# Patient Record
Sex: Female | Born: 1970
Health system: Southern US, Community
[De-identification: ages and names within clinical notes are randomized; demographics above are authoritative.]

## PROBLEM LIST (undated history)

## (undated) DIAGNOSIS — B379 Candidiasis, unspecified: Secondary | ICD-10-CM

## (undated) DIAGNOSIS — A499 Bacterial infection, unspecified: Secondary | ICD-10-CM

## (undated) DIAGNOSIS — A599 Trichomoniasis, unspecified: Secondary | ICD-10-CM

## (undated) DIAGNOSIS — I10 Essential (primary) hypertension: Secondary | ICD-10-CM

## (undated) DIAGNOSIS — K219 Gastro-esophageal reflux disease without esophagitis: Secondary | ICD-10-CM

## (undated) DIAGNOSIS — Z8619 Personal history of other infectious and parasitic diseases: Secondary | ICD-10-CM

## (undated) HISTORY — DX: Candidiasis, unspecified: B37.9

## (undated) HISTORY — PX: HERNIA REPAIR: SHX51

## (undated) HISTORY — DX: Gastro-esophageal reflux disease without esophagitis: K21.9

## (undated) HISTORY — DX: Essential (primary) hypertension: I10

## (undated) HISTORY — DX: Bacterial infection, unspecified: A49.9

## (undated) HISTORY — PX: TUBOPLASTY / TUBOTUBAL ANASTOMOSIS: SUR1392

## (undated) HISTORY — DX: Personal history of other infectious and parasitic diseases: Z86.19

## (undated) HISTORY — DX: Trichomoniasis, unspecified: A59.9

---
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
## 2005-05-21 ENCOUNTER — Emergency Department (HOSPITAL_COMMUNITY): Admission: EM | Admit: 2005-05-21 | Discharge: 2005-05-21 | Payer: Self-pay | Admitting: *Deleted

## 2005-05-25 ENCOUNTER — Other Ambulatory Visit: Admission: RE | Admit: 2005-05-25 | Discharge: 2005-05-25 | Payer: Self-pay | Admitting: Obstetrics and Gynecology

## 2005-05-30 ENCOUNTER — Ambulatory Visit (HOSPITAL_COMMUNITY): Admission: RE | Admit: 2005-05-30 | Discharge: 2005-05-30 | Payer: Self-pay | Admitting: Obstetrics and Gynecology

## 2006-10-22 ENCOUNTER — Ambulatory Visit (HOSPITAL_COMMUNITY): Admission: RE | Admit: 2006-10-22 | Discharge: 2006-10-22 | Payer: Self-pay | Admitting: Obstetrics and Gynecology

## 2007-02-06 ENCOUNTER — Encounter: Admission: RE | Admit: 2007-02-06 | Discharge: 2007-02-06 | Payer: Self-pay | Admitting: Obstetrics and Gynecology

## 2007-12-12 HISTORY — PX: TUBOPLASTY / TUBOTUBAL ANASTOMOSIS: SUR1392

## 2007-12-12 HISTORY — PX: MYOMECTOMY: SHX85

## 2008-05-20 ENCOUNTER — Encounter (INDEPENDENT_AMBULATORY_CARE_PROVIDER_SITE_OTHER): Payer: Self-pay | Admitting: Obstetrics and Gynecology

## 2008-05-20 ENCOUNTER — Inpatient Hospital Stay (HOSPITAL_COMMUNITY): Admission: RE | Admit: 2008-05-20 | Discharge: 2008-05-22 | Payer: Self-pay | Admitting: Obstetrics and Gynecology

## 2008-10-20 ENCOUNTER — Ambulatory Visit (HOSPITAL_COMMUNITY): Admission: RE | Admit: 2008-10-20 | Discharge: 2008-10-20 | Payer: Self-pay | Admitting: Obstetrics and Gynecology

## 2011-01-01 ENCOUNTER — Encounter: Payer: Self-pay | Admitting: Obstetrics and Gynecology

## 2011-04-25 NOTE — Op Note (Signed)
[name], [name]              ACCOUNT NO.:  [id number]   MEDICAL RECORD NO.:  [id number]          PATIENT TYPE:  INP   LOCATION:  9303                          FACILITY:  [facility]   PHYSICIAN:  Crist Fat. Rivard, M.D. DATE OF BIRTH:  1971-07-21   DATE OF PROCEDURE:  05/20/2008  DATE OF DISCHARGE:                               OPERATIVE REPORT   PREOPERATIVE DIAGNOSES:  Large symptomatic uterine fibroids with  bilateral hydrosalpinx and history of abnormal Pap.   POSTOPERATIVE DIAGNOSIS:  Large symptomatic uterine fibroids with  bilateral hydrosalpinx and history of abnormal Pap with pelvic  adhesions.   ANESTHESIA:  General by Dr. Malen Gauze.   PROCEDURE:  Abdominal myomectomy with lysis of adhesions and bilateral  tubal plasty, Pap smear, and placement of JP drain.   SURGEON:  Crist Fat. Rivard, MD   ASSISTANT:  Elmira J. Lowell Guitar, physician assistant.   ESTIMATED BLOOD LOSS:  1300 mL.   PROCEDURE:  After being informed of the planned procedure with possible  complications including bleeding, infection, injury to bowel, bladder,  or ureters, need for transfusion, expected hospital stay and recovery,  informed consent is obtained.  The patient is taken to OR #3, given  general anesthesia with endotracheal intubation without any  complication.  She is placed in a dorsal decubitus position with knee-  high sequential compressive devices.  She is prepped and draped in a  sterile fashion including a vaginal prep and a Foley catheter is  inserted in her bladder.  Our initial intention was to place an  intrauterine Foley catheter for chromopertubation, but due to the large  uterine fibroids, the cervix was placed in a way that this is  impossible.   We proceeded with infiltration of the midline from umbilicus to  symphysis pubis with 20 mL of Marcaine 0.25 and performed a vertical  incision.  This was brought down sharply to the fascia and the fascia  was incised in the midline  fashion.  Linea alba was dissected and  peritoneum was entered bluntly.  At the upper entry of the peritoneum,  we noted adhesions with the omentum with which impact were accessibility  to the uterus.  Those adhesions were sharply dissected using the  technique of clamping with Kelly clamps sectioning and suturing with a  free tie of 0 Vicryl.  After these adhesions have been lysed, we now had  access to the uterus, which was greatly enlarged with 2 large  pedunculated fibroid originating from the fundus of the uterus.  The  right one reaching the costovertebral angle and the left one reaching  the rib cage as well.  We were able to grab the right one and bring it  to the surface of the abdomen, place a corkscrew manipulator in it, and  deliver it through the incision.  This fibroid was approximately 14 cm  and originated from the right cornua.  It was a pedunculated one with  the base of approximately 4 cm.  That base was infiltrated with  vasopressin 50 units in 100 mL dilution and the serosa was opened with  cauterization.  We were able to be enucleate the fibroid and remove it  entirely.  We then closed this fibroid base using 4 figure-of-eight  stitches of 0 Vicryl.  Hemostasis was adequate.  We tried to proceed in  the exact same fashion for the very large left cornual fibroid, which  was approximately 16-17 cm in size, but we were unable to deliver it and  so it required that we extend our incision around the umbilicus on the  left.  This was done and the fibroid was then delivered.  This fibroid  originated from the right cornua, but has the right tube attached to it  with filmy and dense adhesion and the tube was extremely dilated with a  hydrosalpinx.  The diameter of the hydrosalpinx was 3-4 cm.  We  proceeded with sharp dissection of all adhesions to move the tube down  and away from the fibroid, which gave [place] access to its pedicle which was  about 5 cm brought.  This base was  infiltrated with vasopressin same  dilution as previously and we entered the serosa with cauterization.  Again we were able to enucleate the fibroid and remove it completely.  The myometrium was then closed with figure-of-eight stitches of 0 Vicryl  and with a 2-0 Vicryl baseball stitch.  Hemostasis was adequate.  We  could now deliver the uterus completely and we noted multiple posterior  fibroids, which will be addressed first.  There were 4 fibroids grouped  together that are subserosal midbody of the posterior uterus.  The  serosa of this area was infiltrated with vasopressin, opened with  cauterization, and we proceeded with blunt dissection of these fibroids,  which were removed completely measuring a total of 8 cm.  The wall of  the uterus, the myometrium was then closed in multiple layers of figure-  of-eight stitches of 0 Vicryl and the serosa was closed with a baseball  suture of 2-0 Vicryl.  Hemostasis was adequate.  We then removed a  posterior intramyometrial fibroid on the right side lower uterine  segment after infiltrating the serosa with vasopressin, grasping it, and  bluntly and sharply dissecting it away.  After this, a 3-cm fibroid was  removed.  We could sense another one behind it and that myometrium was  infiltrated with vasopressin and we opened it to grab the fibroid to  realize that this was a submucosal fibroid originating from the anterior  wall of the uterus.  Dissection of this fibroid was stopped at this  point and we moved to the anterior aspect of the uterus.  More adhesions  were seen between the sigmoid colon and the posterior aspect of the  uterus as well as the left tube and those were sharply dissected  meticulously.  The bowel could now be packed away in the upper abdomen.  We now moved our attention to the anterior aspect of the uterus and  noted a large anterior intramyometrial fibroid measuring 6-7 cm.  The  serosa overlooking this fibroid was  infiltrated with vasopressin.  We  performed an anterior vertical incision, grabbed the fibroid, and  dissected it sharply and bluntly systematically.  Through this incision,  we could now feel the anterior fibroid that was more on the left side,  which we believe was the one that was the submucosal fibroid.  The  myometrium overlooking this fibroid was infiltrated with vasopressin,  opened, the fibroid was grasped, and was dissected completely measuring  approximately 5 cm and we  were right it was the submucosal fibroid.  During that dissection, we opened the endometrial cavity again.  Now  care was taken to close the endometrial cavity without putting suture  inside it using interrupted suture of 2-0 Vicryl and covering this with  a layer of myometrium using figure-of-eight stitches of 2-0 Vicryl.  Once the cavity was completely closed anteriorly, we could now close the  full depth of the myometrium in multiple layers of figure-of-eight  stitches of 0 Vicryl finishing with the serosal baseball stitch of 2-0  Vicryl.  Hemostasis was adequate.  Having done this, we went back to the  posterior incision we had made previously which also entered the  endometrial cavity and we closed this one again with 2-0 Vicryl  interrupted suture and then myometrial figure-of-eight stitches of 0  Vicryl and finally a serosal baseball stitch of 2-0 Vicryl.  Hemostasis  was adequate.  We had one more small posterior fibroid of 1 cm in the  right lower uterine segment posteriorly.  Serosa was infiltrated with  vasopressin, opened with cauterization, fibroid was grasped, measured  approximately 2 cm, and was bluntly and sharply dissected.  Myometrium  was closed with 2 figure-of-eight stitches of 0 Vicryl and the serosa  was closed with a baseball stitch of 2-0 Vicryl.  Hemostasis was  adequate.  Multiple small below 1 cm fibroids were also removed as we  proceeded.  Finally, we had an anterior lower uterine  segment fibroid  that was located on the right side.  The broad ligament was opened to  the lower the bladder.  The serosa was infiltrated with vasopressin.  The serosa was opened with cauterization.  The fibroid was grasped and  dissected away.  The myometrium was closed with three figure-of-eight  stitches of 0 Vicryl.  The broad ligament was then closed with running  suture of 2-0 Vicryl.   All the fibroids have been removed for a total of 15 fibroids.  The  specimen have been weighed at 3000 grams.  We irrigated profusely and  noted a satisfactory hemostasis.  We were now going to spend our  attention on the cure of bilateral hydrosalpinx.  The right tube was  then denuded of all adhesions.  A breech in the serosa was noted and was  closed with interrupted sutures of 6-0 Vicryl.  The distal end was  easily identified, mobilized away from the ovary, and opened with  scissors in an X fashion.  We then everted this opening using  interrupted sutures of 6-0 Vicryl.  We could maintain the eversion and  the opening of the tube.  When we opened the tube, there were evacuation  of chocolate-type fluid.  No where else in the pelvis were there any  evidence of endometriosis and suspicion was more of accumulation of old  blood.   We then put our attention on the right side and we proceeded with  systematic meticulous lysis of adhesions to free the hydrosalpinx which  measured approximately 3 cm in diameter and was approximately 8 cm long.  The ovary was normal.  We opened the terminal end of this hydrosalpinx  using scissors in a T fashion and we to everted the endosalpinx and  fixed it to the serosa using interrupted sutures of 6-0 Vicryl.  This  hydrosalpinx was filled with clear fluid.   At the end of the procedure, we irrigated profusely both tubes and noted  satisfactory hemostasis and a normal wide opening at  the end of the  hydrosalpinx.  We then irrigated profusely with warm saline  and noted a  satisfactory hemostasis and we decided to place multiple sheets of  Interceed for prevention of future adhesions.  Each tube with its ovary  was wrapped in a full sheet and all incisions on the uterus for a total  of 7 were covered with half sheets of Interceed.  Abdominal packs and  instruments were removed.  Under fascia hemostasis was completed with  cautery and the fascia was initially closed with interrupted sutures of  1 Vicryl around the umbilicus and then it was closed with 2 running  sutures of 1 Vicryl meeting midline.  Wound was irrigated with warm  saline.  Hemostasis was completed with cautery and the skin was closed  around the umbilicus with interrupted subcuticular sutures of 3-0  Monocryl.  A JP drain #10 was placed in the incision with the right  counter incision.  The drain was sutured to the skin with a 0 silk  suture.  We then closed the reminder of the incision from the umbilicus  to the symphysis pubis with a running subcuticular suture of 3-0  Monocryl and Steri-Strips.   The wound was covered and dressing was applied.  The patient was  repositioned in lithotomy position.  The vagina was irrigated with  saline and a speculum was inserted.  The cervix was now easily  visualized and we proceeded with a thin prep Pap smear.  The patient's  legs were repositioned in dorsal decubitus position, legs stretched.  Instruments and sponge count was complete x2.  Estimated blood loss was  1300 mL.  The procedure was very well tolerated by the patient who was  taken to recovery room in a well and stable condition.   SPECIMEN:  Consisted of 15 fibroids for a total of 3000 g sent to  pathology as well as thin prep cervical cytology sent to pathology.      Crist Fat Rivard, M.D.  Electronically Signed     SAR/MEDQ  D:  05/20/2008  T:  05/21/2008  Job:  [id number]

## 2011-04-25 NOTE — H&P (Signed)
[name], [name]              ACCOUNT NO.:  [id number]   MEDICAL RECORD NO.:  [id number]          PATIENT TYPE:  AMB   LOCATION:  SDC                           FACILITY:  [facility]   PHYSICIAN:  Dois Davenport A. Rivard, M.D. DATE OF BIRTH:  May 30, 1971   DATE OF ADMISSION:  DATE OF DISCHARGE:                              HISTORY & PHYSICAL   HISTORY OF PRESENT ILLNESS:  [name] is a 40 year old married African  American female, gravida 0, who presents for an abdominal myomectomy  with bilateral tuboplasties because of very large uterine fibroids and  bilateral hydrosalpinges.  Since 2002, the patient has had increasing  uterine fibroids with minimal symptoms.  The patient reports a 3-day  menstrual flow during which time she changes 2 pads every 3 hours and  passes clots as large as 3 cm.  In spite of this, she has minimal  cramping.  Denies any intermenstrual bleeding, dysuria, problems  emptying her bladder, or pelvic pressure.  However, the patient does  admit to increase urinary frequency and occasional 1-2 nocturia.  In  February 2003, the patient was given Lupron Depot 11.25 mg in an effort  to decrease the size of her fibroids, to minimize her abdominal  incision.  A followup ultrasound in April 2009, showed a uterus  measuring 8.63 x 4.81 x 5.48-cm with bilateral hydrosalpinges and normal-  appearing ovaries.  Additionally, three measurable fibroids were  identified.  A left bundle pedunculated fibroid measuring 10.91 x 7.81 x  8.99-cm, a left pedunculated fibroid measuring 14.70 x 10.40 x 13.51-cm,  and a right pedunculated fibroid measuring 16.39 x 9.61 x 15.21-cm.  Due  to the mass effect of the patient's uterus, she has been unable to have  a Pap smear since 2007.  Additionally, the patient has a desire to  pursue conception.  Given these considerations, along with the  continuously enlarging fibroids, the patient desires to proceed with  abdominal myomectomy and bilateral  tuboplasties.   OB HISTORY:  Gravida 0.   GYN HISTORY:  Menarche, 40 years old.  The patient's last menstrual  period was Apr 19, 2008.  She does not use any contraception.  Does have  a remote history of chlamydia and HPV, had an abnormal Pap smear  followed by colposcopy in 2007.  Her last Pap smear was in November  2007. (as mentioned in HPI, the mass effect of the patient's uterus  displaces her cervix such that she has not been able to have a Pap smear  since this time).   MEDICAL HISTORY:  Positive for anemia.   SURGICAL HISTORY:  The patient had an umbilical hernia repair.  She does  report a history of blood transfusion.   FAMILY HISTORY:  Negative.   SOCIAL HISTORY:  The patient is a Merchandiser, retail with [employer], and she is  married.   HABITS:  She does not use tobacco, alcohol, or illicit drugs.   CURRENT MEDICATIONS:  None.   ALLERGIES:  She has no known drug allergies.   REVIEW OF SYSTEMS:  The patient denies any chest pain, shortness of  breath,  fever, nausea, vomiting, diarrhea, headache, or vision changes,  and except as is mentioned in the patient's history of present illness.  The patient's review of systems is negative.   PHYSICAL EXAMINATION:  VITAL SIGNS:  Blood pressure 118/80, weight is  248, height is 5 feet 5 inches tall, and pulse is 72.  EAR, NOSE AND THROAT:  Pupils are equal.  Hearing is normal.  Throat is  clear.  NECK:  Supple without masses.  There is no thyromegaly or cervical  adenopathy.  HEART:  Regular rate and rhythm.  LUNGS:  Clear.  BACK:  No CVA tenderness.  ABDOMEN:  There is a firm, irregular mass, arising from the pelvis to  the level of the patient's rib cage.  There is no tenderness and  assessment of organomegaly is precluded by the mass effect of the  patient's fibroids.  EXTREMITIES:  Without clubbing, cyanosis, or edema.  PELVIC:  Deferred due to the limited nature of such exam due to the mass  effect of the patient's  uterus/fibroids.   IMPRESSION:  1. Very large uterine fibroids.  2. Bilateral hydrosalpinges.  3. History of abnormal Pap smear.   DISPOSITION:  A discussion was held with the patient regarding the  indications for her procedures along with their risks, which include but  are not limited to reaction to anesthesia, damage to adjacent organs,  infection, excessive bleeding, and a remote possibility that she may  have to undergo hysterectomy.  The patient verbalized understanding of  these risks and has consented to proceed with an abdominal myomectomy  with bilateral tuboplasties and Pap smear at [facility]  [place], May 20, 2008, at 8:30 a.m.      Elmira J. Adline Peals.      Crist Fat Rivard, M.D.  Electronically Signed    EJP/MEDQ  D:  05/18/2008  T:  05/19/2008  Job:  [id number]

## 2011-04-28 NOTE — Discharge Summary (Signed)
NAMEANGELEA, PENNY              ACCOUNT NO.:  [id number]   MEDICAL RECORD NO.:  [id number]          PATIENT TYPE:  INP   LOCATION:  9303                          FACILITY:  [facility]   PHYSICIAN:  Crist Fat. Rivard, M.D. DATE OF BIRTH:  11-06-1971   DATE OF ADMISSION:  05/20/2008  DATE OF DISCHARGE:  05/22/2008                               DISCHARGE SUMMARY   DISCHARGE DIAGNOSES:  1. Large uterine fibroids  2. Bilateral hydrosalpinx.  3. Pelvic adhesions.  4. History of abnormal Pap smear.   OPERATION:  On the date of admission, the patient underwent a multiple  abdominal myomectomy with lysis of adhesions, bilateral tuboplasty, and  Pap smear; tolerating all procedures well.  The patient had 15 uterine  fibroids removed with the largest measuring 16 cm.  All of which had an  aggregate weight of 3231 grams.   HISTORY OF PRESENT ILLNESS:  Ms. Georgia is a 40 year old married African  American female, gravida 0, who presents for an abdominal myomectomy  with bilateral tuboplasties because of very large uterine fibroids and  bilateral hydrosalpinges.  Please see the patient's dictated history and  physical examination for details.   PREOPERATIVE PHYSICAL EXAM:  VITAL SIGNS:  Blood pressure 118/80, pulse  is 72, weight 248 pounds, and height is 5 feet 5 inches tall.  GENERAL:  Within normal limits.  ABDOMEN:  Though noted on the abdominal exam that there was a firm mass,  which was irregular, arising from the pelvis to the level of the  patient's rib cage.  There was no tenderness and assessment of  organomegaly was precluded by the mass effect of the patient's fibroids.  PELVIC:  Deferred due to the limited nature of such exam caused by the  mass effect of the patient's uterus/fibroids.   HOSPITAL COURSE:  On the date of admission, the patient underwent  aforementioned procedures, tolerating them all well.  Postoperative  course was unremarkable with the patient tolerating the  postop  hemoglobin of 8.4 (preop hemoglobin was 13.4).  By postoperative day #2,  the patient had resumed a bowel and bladder function, and was therefore  deemed ready for discharge home.   DISCHARGE MEDICATIONS:  The patient was directed to the home medication  reconciliation form.  She was also asked to take:  1. Ibuprofen 600 mg with food every 6 hours for 5 days, then as needed      for pain.  2. Tylox 1-2 tablets every 4 hours as needed for pain.  3. Colace 100 mg 1-2 tablets until bowel movements are regular.  4. Nu-Iron 150 mg twice daily.   FOLLOWUP:  The patient is scheduled for a followup visit with Dr. Estanislado Pandy  on June 26, 2008, at 12:15 p.m.   DISCHARGE INSTRUCTIONS:  The patient was given a copy of [facility] postoperative instruction sheet.  She was further  advised to avoid driving for 2 weeks, heavy lifting for 4 weeks,  intercourse for 6 weeks.  She may shower.  She may walk up steps.  The  patient's diet was without restriction.   FINAL  PATHOLOGY:  Leiomyoma, one with infarction (clinically uterine  fibroids).  Leiomyoma 18 cm, clinically uterine fibroid, lobular portion  of adipose tissue.      Elmira J. Adline Peals.      Crist Fat Rivard, M.D.  Electronically Signed    EJP/MEDQ  D:  06/05/2008  T:  06/05/2008  Job:  [id number]

## 2011-09-07 ENCOUNTER — Other Ambulatory Visit: Payer: Self-pay | Admitting: Obstetrics and Gynecology

## 2011-09-07 DIAGNOSIS — Z1231 Encounter for screening mammogram for malignant neoplasm of breast: Secondary | ICD-10-CM

## 2011-09-07 LAB — CBC
Hemoglobin: 13.4
MCHC: 34.3
MCHC: 34.6
RBC: 2.79 — ABNORMAL LOW
RDW: 14.1
RDW: 14.3

## 2011-09-07 LAB — BASIC METABOLIC PANEL
BUN: 10
CO2: 26
CO2: 27
Calcium: 8.4
Calcium: 9.7
Creatinine, Ser: 1.08
Creatinine, Ser: 1.22 — ABNORMAL HIGH
GFR calc Af Amer: >60
Glucose, Bld: 86

## 2011-09-07 LAB — PREGNANCY, URINE: Preg Test, Ur: NEGATIVE

## 2011-09-21 ENCOUNTER — Ambulatory Visit
Admission: RE | Admit: 2011-09-21 | Discharge: 2011-09-21 | Disposition: A | Discharge status: Home / Self Care | Payer: 59 | Source: Ambulatory Visit | Attending: Obstetrics and Gynecology | Admitting: Obstetrics and Gynecology

## 2011-09-21 DIAGNOSIS — Z1231 Encounter for screening mammogram for malignant neoplasm of breast: Secondary | ICD-10-CM

## 2012-10-16 ENCOUNTER — Ambulatory Visit (INDEPENDENT_AMBULATORY_CARE_PROVIDER_SITE_OTHER): Payer: 59 | Admitting: Obstetrics and Gynecology

## 2012-10-16 ENCOUNTER — Encounter: Payer: Self-pay | Admitting: Obstetrics and Gynecology

## 2012-10-16 VITALS — BP 138/76 | Wt 243.0 lb

## 2012-10-16 DIAGNOSIS — Z8619 Personal history of other infectious and parasitic diseases: Secondary | ICD-10-CM | POA: Insufficient documentation

## 2012-10-16 DIAGNOSIS — Z124 Encounter for screening for malignant neoplasm of cervix: Secondary | ICD-10-CM

## 2012-10-16 DIAGNOSIS — A499 Bacterial infection, unspecified: Secondary | ICD-10-CM | POA: Insufficient documentation

## 2012-10-16 DIAGNOSIS — N898 Other specified noninflammatory disorders of vagina: Secondary | ICD-10-CM

## 2012-10-16 DIAGNOSIS — B379 Candidiasis, unspecified: Secondary | ICD-10-CM | POA: Insufficient documentation

## 2012-10-16 DIAGNOSIS — A599 Trichomoniasis, unspecified: Secondary | ICD-10-CM | POA: Insufficient documentation

## 2012-10-16 DIAGNOSIS — N899 Noninflammatory disorder of vagina, unspecified: Secondary | ICD-10-CM

## 2012-10-16 DIAGNOSIS — Z01419 Encounter for gynecological examination (general) (routine) without abnormal findings: Secondary | ICD-10-CM

## 2012-10-16 NOTE — Progress Notes (Signed)
The patient reports:she complains of fatigue. Has tingling inside vagina. No d/c, odor or itching. Contraception:no method  Last mammogram: was normal October 2012 Last pap: was normal September  2012  GC/Chlamydia cultures offered: declined HIV/RPR/HbsAg offered:  declined HSV 1 and 2 glycoprotein offered: declined  Menstrual cycle regular and monthly: Yes Menstrual flow normal: Yes  Urinary symptoms: none Normal bowel movements: Yes Reports abuse at home: No  Subjective:    [name] is a 41 y.o. female, No obstetric history on file., who presents for an annual exam.     History   Social History  . Marital Status: Married    Spouse Name: N/A    Number of Children: N/A  . Years of Education: N/A   Social History Main Topics  . Smoking status: Never Smoker   . Smokeless tobacco: Never Used  . Alcohol Use: No  . Drug Use: No  . Sexually Active: Yes     Comment: none blocked tubes   Other Topics Concern  . None   Social History Narrative  . None    Menstrual cycle:   LMP: end of October           Cycle: normal Last one was only 1 day of spotting with UPT-  The following portions of the patient's history were reviewed and updated as appropriate: allergies, current medications, past family history, past medical history, past social history, past surgical history and problem list.  Review of Systems Pertinent items are noted in HPI. Breast:Negative for breast lump,nipple discharge or nipple retraction Gastrointestinal: Negative for abdominal pain, change in bowel habits or rectal bleeding Urinary:negative   Objective:    There were no vitals taken for this visit.    Weight:  Wt Readings from Last 1 Encounters:  No data found for Wt          BMI: There is no height or weight on file to calculate BMI.  General Appearance: Alert, appropriate appearance for age. No acute distress HEENT: Grossly normal Neck / Thyroid: Supple, no masses, nodes or  enlargement Lungs: clear to auscultation bilaterally Back: No CVA tenderness Breast Exam: No masses or nodes.No dimpling, nipple retraction or discharge. Cardiovascular: Regular rate and rhythm. S1, S2, no murmur Gastrointestinal: Soft, non-tender, no masses or organomegaly Pelvic Exam: Vulva and vagina appear normal. Bimanual exam reveals normal uterus and adnexa. Rectovaginal: normal rectal, no masses Lymphatic Exam: Non-palpable nodes in neck, clavicular, axillary, or inguinal regions Skin: no rash or abnormalities Neurologic: Normal gait and speech, no tremor  Psychiatric: Alert and oriented, appropriate affect.     Assessment:    Normal gyn exam    Plan:    mammogram pap smear return annually or prn STD screening: declined Contraception:no method   Silverio Lay MD

## 2012-10-17 LAB — PAP IG W/ RFLX HPV ASCU

## 2014-08-03 ENCOUNTER — Other Ambulatory Visit: Payer: Self-pay | Admitting: Obstetrics and Gynecology

## 2014-08-03 DIAGNOSIS — Z1231 Encounter for screening mammogram for malignant neoplasm of breast: Secondary | ICD-10-CM

## 2014-08-11 ENCOUNTER — Encounter (INDEPENDENT_AMBULATORY_CARE_PROVIDER_SITE_OTHER): Payer: Self-pay

## 2014-08-11 ENCOUNTER — Ambulatory Visit
Admission: RE | Admit: 2014-08-11 | Discharge: 2014-08-11 | Disposition: A | Discharge status: Home / Self Care | Payer: BC Managed Care – PPO | Source: Ambulatory Visit | Attending: Obstetrics and Gynecology | Admitting: Obstetrics and Gynecology

## 2014-08-11 DIAGNOSIS — Z1231 Encounter for screening mammogram for malignant neoplasm of breast: Secondary | ICD-10-CM

## 2014-08-12 ENCOUNTER — Other Ambulatory Visit: Payer: Self-pay | Admitting: Obstetrics and Gynecology

## 2014-08-12 DIAGNOSIS — R928 Other abnormal and inconclusive findings on diagnostic imaging of breast: Secondary | ICD-10-CM

## 2014-08-21 ENCOUNTER — Other Ambulatory Visit: Payer: BC Managed Care – PPO

## 2014-08-27 ENCOUNTER — Ambulatory Visit
Admission: RE | Admit: 2014-08-27 | Discharge: 2014-08-27 | Disposition: A | Discharge status: Home / Self Care | Payer: BC Managed Care – PPO | Source: Ambulatory Visit | Attending: Obstetrics and Gynecology | Admitting: Obstetrics and Gynecology

## 2014-08-27 ENCOUNTER — Other Ambulatory Visit: Payer: Self-pay | Admitting: Obstetrics and Gynecology

## 2014-08-27 DIAGNOSIS — R928 Other abnormal and inconclusive findings on diagnostic imaging of breast: Secondary | ICD-10-CM

## 2014-09-01 ENCOUNTER — Other Ambulatory Visit: Payer: Self-pay | Admitting: Obstetrics and Gynecology

## 2014-09-01 DIAGNOSIS — R928 Other abnormal and inconclusive findings on diagnostic imaging of breast: Secondary | ICD-10-CM

## 2014-09-03 ENCOUNTER — Other Ambulatory Visit: Payer: Self-pay | Admitting: Obstetrics and Gynecology

## 2014-09-03 ENCOUNTER — Ambulatory Visit
Admission: RE | Admit: 2014-09-03 | Discharge: 2014-09-03 | Disposition: A | Discharge status: Home / Self Care | Payer: BC Managed Care – PPO | Source: Ambulatory Visit | Attending: Obstetrics and Gynecology | Admitting: Obstetrics and Gynecology

## 2014-09-03 DIAGNOSIS — R928 Other abnormal and inconclusive findings on diagnostic imaging of breast: Secondary | ICD-10-CM

## 2014-09-03 DIAGNOSIS — N6001 Solitary cyst of right breast: Secondary | ICD-10-CM

## 2016-02-02 IMAGING — MG MM DIAG BREAST TOMO UNI RIGHT
2 series · 2 of 10 positions shown · non-contrast
Comparison: Mammograms dated 08/11/2014 and 09/21/2011

CLINICAL DATA: 42-year-old female, callback from screening
mammogram for possible architectural distortion

EXAM:
DIGITAL DIAGNOSTIC RIGHT MAMMOGRAM WITH 3D TOMOSYNTHESIS
ULTRASOUND RIGHT BREAST

[R MLO tomo · tomo slice 47/93.0]
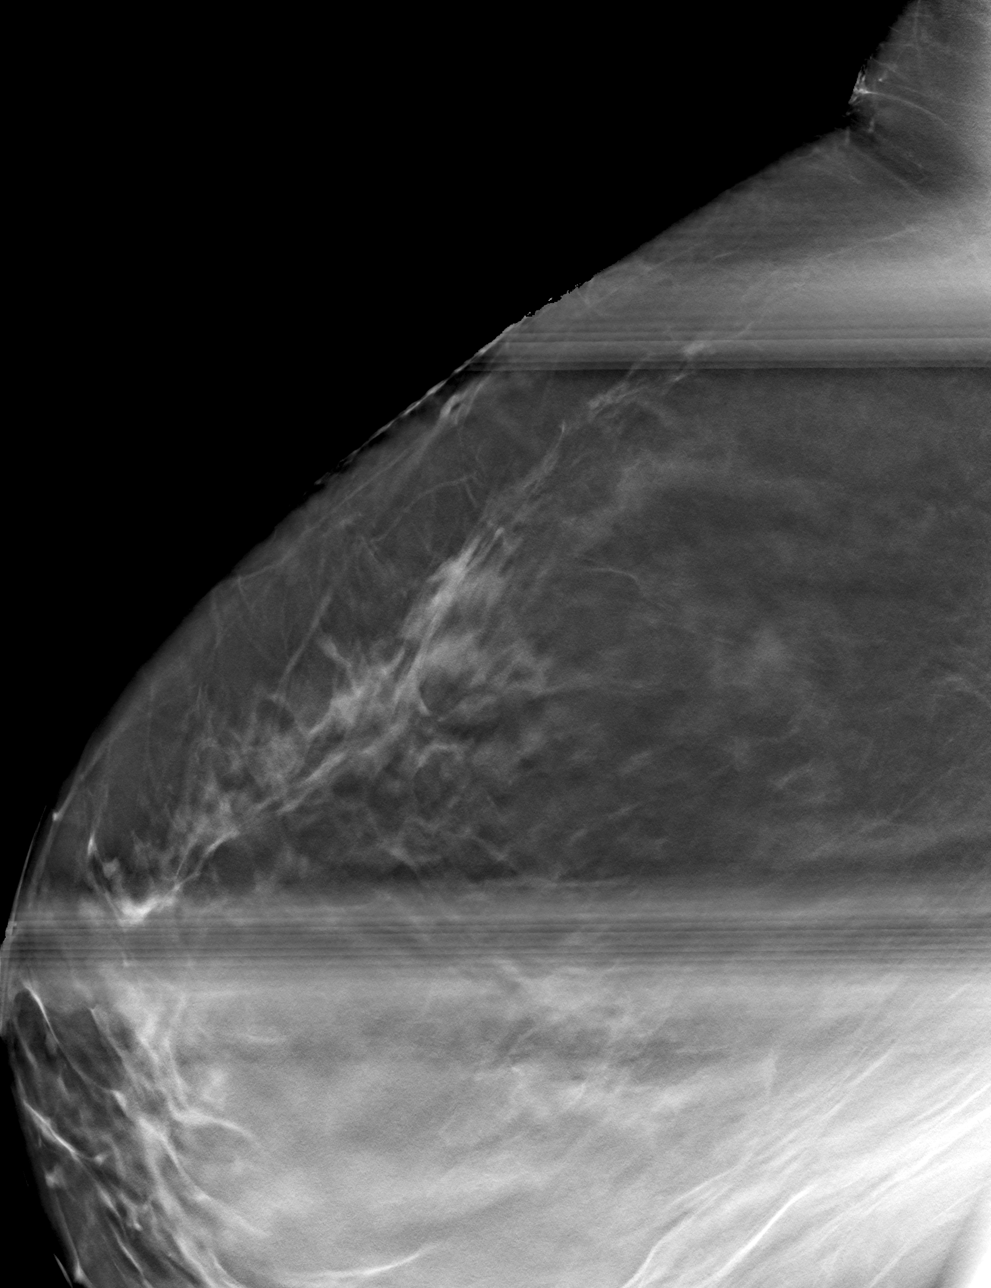

[R CC tomo · tomo slice 44/87.0]
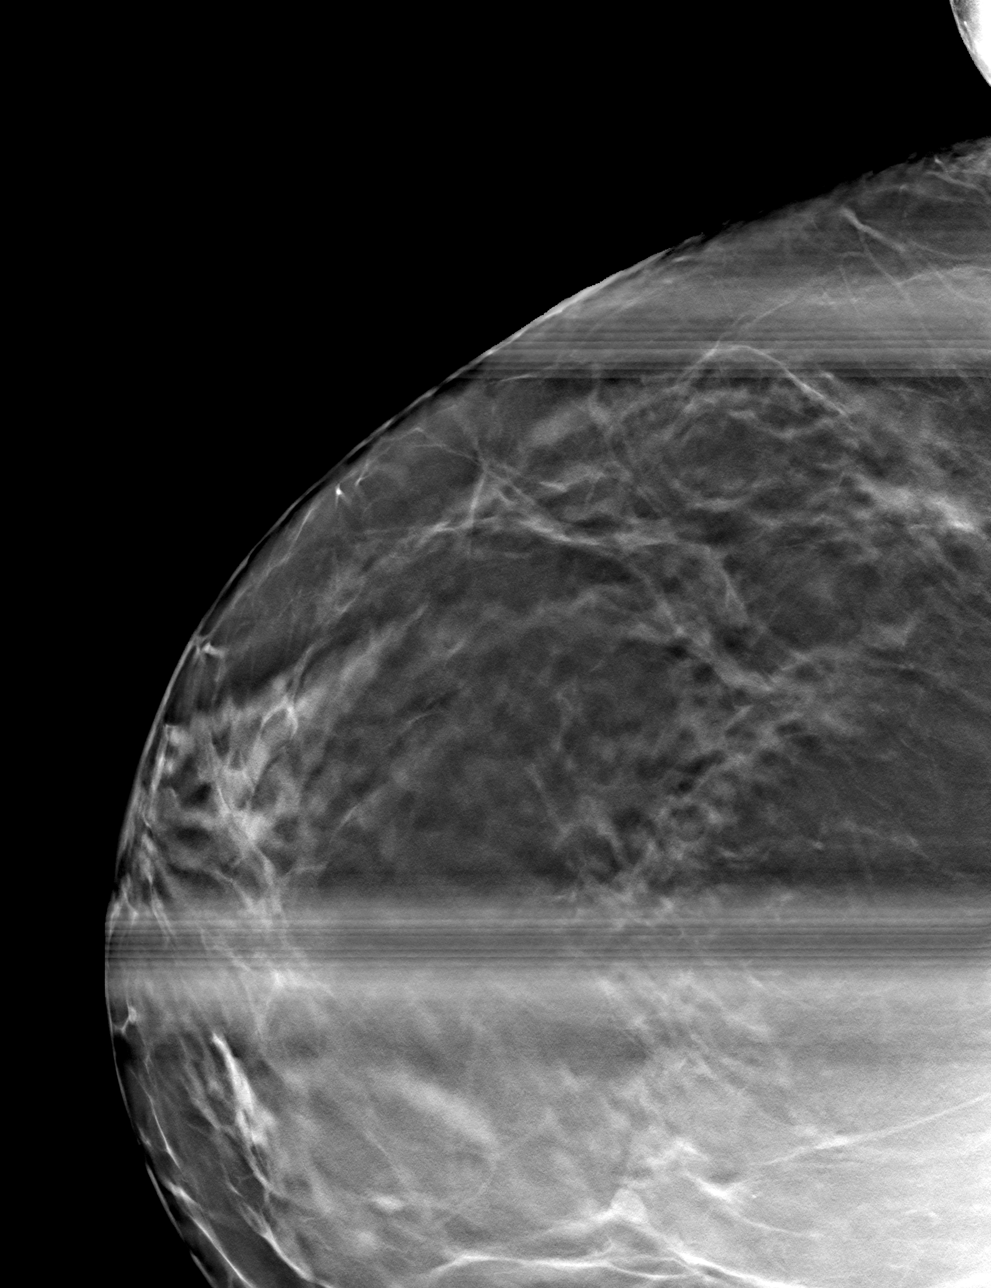

[2 of 10 positions shown; findings below may reference images not displayed]

ACR Breast Density Category c: The breast tissue is heterogeneously
dense, which may obscure small masses.
FINDINGS: Spot compression and spot tomosynthesis CC and MLO views of the
right breast were performed. The questioned architectural distortion
within the superior right breast on screening mammogram is not seen
on the additional views. No suspicious mass, calcifications, or
other abnormality is identified.

Mammographic images were processed with CAD.

Targeted ultrasound of the superior right breast demonstrates a 4 x
3 x 4 mm hypoechoic oval mass with indistinct margins at 2 o'clock,
5 cm from the nipple. This finding may represent a cyst but was not
able to be definitively characterized. No correlate for the possible
distortion seen on screening mammogram was identified.
IMPRESSION: Indeterminate mass at 2 o'clock, 5 cm from the nipple, right breast

RECOMMENDATION:
Cyst aspiration may be attempted. If unable to aspirate,
ultrasound-guided core biopsy is recommended.

I have discussed the findings and recommendations with the patient.
Results were also provided in writing at the conclusion of the
visit. If applicable, a reminder letter will be sent to the patient
regarding the next appointment.

BI-RADS CATEGORY  4: Suspicious.

## 2016-02-02 IMAGING — US US BREAST LTD UNI RIGHT INC AXILLA
1 series · 1 of 1 positions shown · non-contrast
Comparison: Mammograms dated 08/11/2014 and 09/21/2011

CLINICAL DATA: 42-year-old female, callback from screening
mammogram for possible architectural distortion

EXAM:
DIGITAL DIAGNOSTIC RIGHT MAMMOGRAM WITH 3D TOMOSYNTHESIS
ULTRASOUND RIGHT BREAST

[Series 1: superficial breast · 1 of 1 slices shown]
[im 1/1]
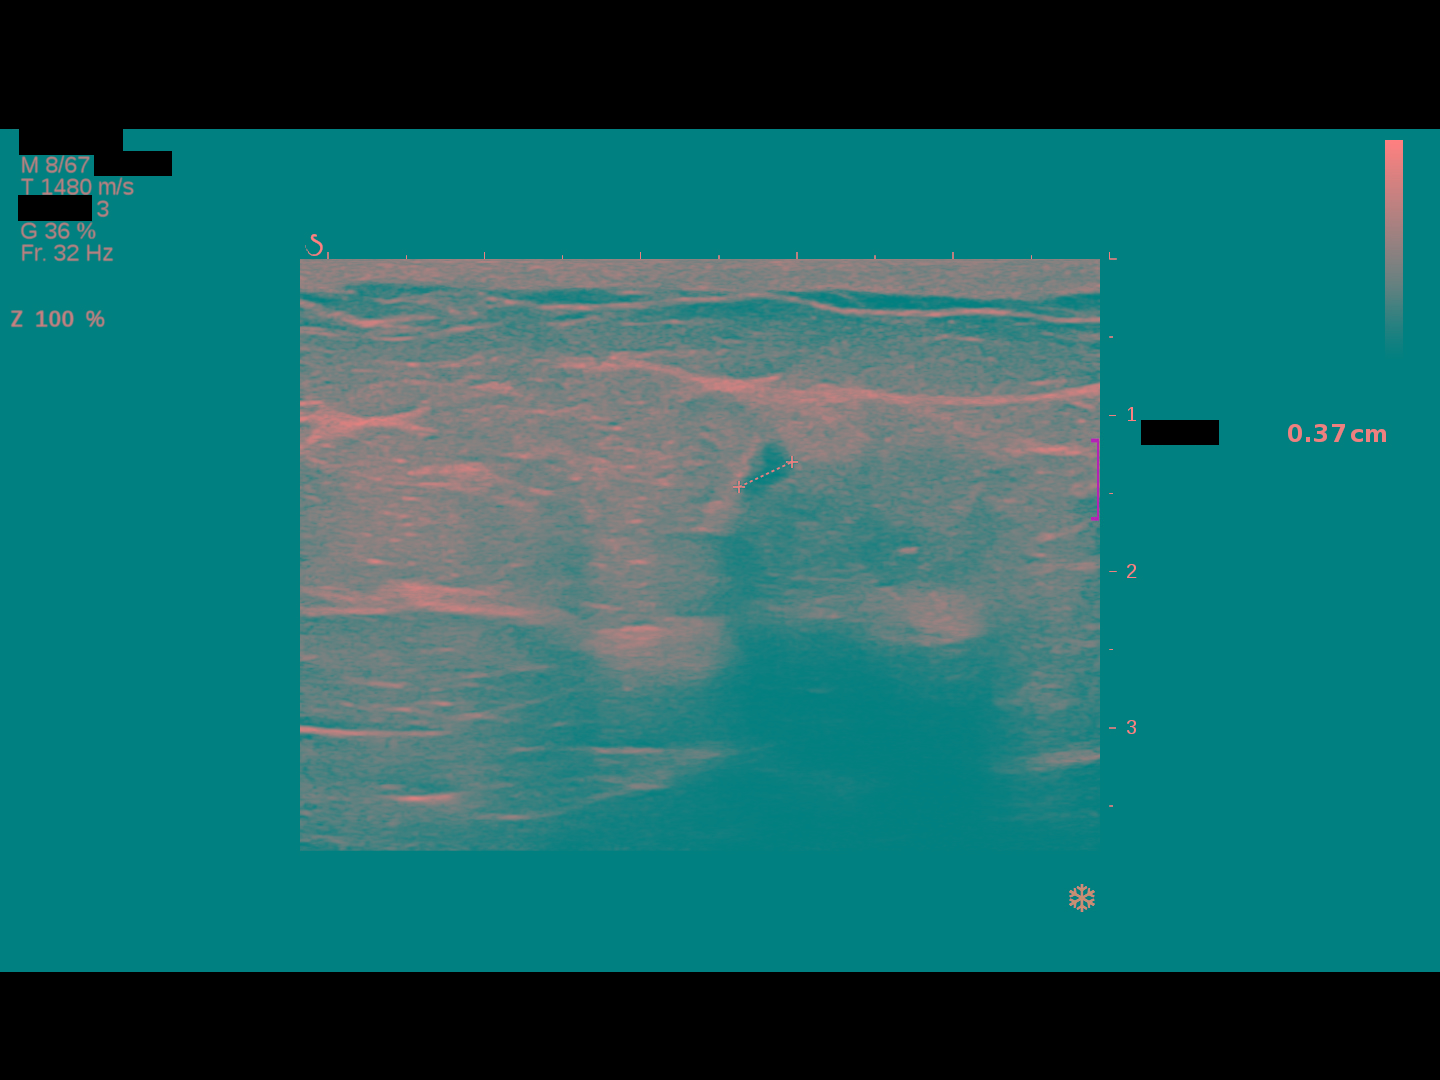

[1 of 1 positions shown; findings below may reference images not displayed]

ACR Breast Density Category c: The breast tissue is heterogeneously
dense, which may obscure small masses.
FINDINGS: Spot compression and spot tomosynthesis CC and MLO views of the
right breast were performed. The questioned architectural distortion
within the superior right breast on screening mammogram is not seen
on the additional views. No suspicious mass, calcifications, or
other abnormality is identified.

Mammographic images were processed with CAD.

Targeted ultrasound of the superior right breast demonstrates a 4 x
3 x 4 mm hypoechoic oval mass with indistinct margins at 2 o'clock,
5 cm from the nipple. This finding may represent a cyst but was not
able to be definitively characterized. No correlate for the possible
distortion seen on screening mammogram was identified.
IMPRESSION: Indeterminate mass at 2 o'clock, 5 cm from the nipple, right breast

RECOMMENDATION:
Cyst aspiration may be attempted. If unable to aspirate,
ultrasound-guided core biopsy is recommended.

I have discussed the findings and recommendations with the patient.
Results were also provided in writing at the conclusion of the
visit. If applicable, a reminder letter will be sent to the patient
regarding the next appointment.

BI-RADS CATEGORY  4: Suspicious.

## 2018-12-30 DIAGNOSIS — R928 Other abnormal and inconclusive findings on diagnostic imaging of breast: Secondary | ICD-10-CM | POA: Diagnosis not present

## 2019-08-23 DIAGNOSIS — R5381 Other malaise: Secondary | ICD-10-CM | POA: Diagnosis not present

## 2019-08-23 DIAGNOSIS — R05 Cough: Secondary | ICD-10-CM | POA: Diagnosis not present

## 2019-08-29 DIAGNOSIS — M791 Myalgia, unspecified site: Secondary | ICD-10-CM | POA: Diagnosis not present

## 2019-08-29 DIAGNOSIS — R5383 Other fatigue: Secondary | ICD-10-CM | POA: Diagnosis not present

## 2020-01-27 DIAGNOSIS — Z1211 Encounter for screening for malignant neoplasm of colon: Secondary | ICD-10-CM | POA: Diagnosis not present

## 2020-01-27 DIAGNOSIS — Z1231 Encounter for screening mammogram for malignant neoplasm of breast: Secondary | ICD-10-CM | POA: Diagnosis not present

## 2020-01-27 DIAGNOSIS — Z124 Encounter for screening for malignant neoplasm of cervix: Secondary | ICD-10-CM | POA: Diagnosis not present

## 2020-01-27 DIAGNOSIS — Z01419 Encounter for gynecological examination (general) (routine) without abnormal findings: Secondary | ICD-10-CM | POA: Diagnosis not present

## 2020-03-09 DIAGNOSIS — K219 Gastro-esophageal reflux disease without esophagitis: Secondary | ICD-10-CM | POA: Diagnosis not present

## 2020-03-09 DIAGNOSIS — Z1211 Encounter for screening for malignant neoplasm of colon: Secondary | ICD-10-CM | POA: Diagnosis not present

## 2020-03-09 LAB — COMPREHENSIVE METABOLIC PANEL
Calcium: 10 (ref 8.7–10.7)
GFR calc Af Amer: 63
GFR calc non Af Amer: 54

## 2020-03-09 LAB — CBC AND DIFFERENTIAL
HCT: 40 (ref 36–46)
Hemoglobin: 12.9 (ref 12.0–16.0)
Platelets: 242 (ref 150–399)
WBC: 8.9

## 2020-03-09 LAB — BASIC METABOLIC PANEL
BUN: 14 (ref 4–21)
CO2: 22 (ref 13–22)
Chloride: 104 (ref 99–108)
Creatinine: 1.2 — AB (ref 0.5–1.1)
Glucose: 82
Potassium: 4.2 (ref 3.4–5.3)

## 2020-03-09 LAB — HEPATIC FUNCTION PANEL
ALT: 18 (ref 7–35)
AST: 18 (ref 13–35)
Alkaline Phosphatase: 69 (ref 25–125)
Bilirubin, Direct: 0.08 (ref 0.01–0.4)
Bilirubin, Total: 0.2

## 2020-03-09 LAB — CBC: RBC: 4.59 (ref 3.87–5.11)

## 2020-03-09 LAB — TSH: TSH: 2.3 (ref 0.41–5.90)

## 2020-04-12 ENCOUNTER — Other Ambulatory Visit: Payer: Self-pay

## 2020-04-12 ENCOUNTER — Encounter: Payer: Self-pay | Admitting: Internal Medicine

## 2020-04-12 ENCOUNTER — Ambulatory Visit (INDEPENDENT_AMBULATORY_CARE_PROVIDER_SITE_OTHER): Payer: BC Managed Care – PPO | Admitting: Internal Medicine

## 2020-04-12 VITALS — BP 134/92 | HR 79 | Temp 98.1°F | Ht 64.5 in | Wt 289.6 lb

## 2020-04-12 DIAGNOSIS — H6123 Impacted cerumen, bilateral: Secondary | ICD-10-CM

## 2020-04-12 DIAGNOSIS — R635 Abnormal weight gain: Secondary | ICD-10-CM

## 2020-04-12 DIAGNOSIS — E559 Vitamin D deficiency, unspecified: Secondary | ICD-10-CM

## 2020-04-12 DIAGNOSIS — K219 Gastro-esophageal reflux disease without esophagitis: Secondary | ICD-10-CM

## 2020-04-12 DIAGNOSIS — Z79899 Other long term (current) drug therapy: Secondary | ICD-10-CM | POA: Diagnosis not present

## 2020-04-12 DIAGNOSIS — R600 Localized edema: Secondary | ICD-10-CM | POA: Diagnosis not present

## 2020-04-12 DIAGNOSIS — Z6841 Body Mass Index (BMI) 40.0 and over, adult: Secondary | ICD-10-CM

## 2020-04-12 DIAGNOSIS — R030 Elevated blood-pressure reading, without diagnosis of hypertension: Secondary | ICD-10-CM

## 2020-04-12 DIAGNOSIS — R0683 Snoring: Secondary | ICD-10-CM

## 2020-04-12 LAB — POCT URINALYSIS DIPSTICK
Bilirubin, UA: NEGATIVE
Blood, UA: NEGATIVE
Glucose, UA: NEGATIVE
Ketones, UA: NEGATIVE
Nitrite, UA: NEGATIVE
Protein, UA: NEGATIVE
Spec Grav, UA: 1.02 (ref 1.010–1.025)
Urobilinogen, UA: 0.2 E.U./dL
pH, UA: 7.5 (ref 5.0–8.0)

## 2020-04-12 LAB — POCT UA - MICROALBUMIN
Albumin/Creatinine Ratio, Urine, POC: <30
Creatinine, POC: 200 mg/dL
Microalbumin Ur, POC: 10 mg/L

## 2020-04-12 MED ORDER — DEXILANT 60 MG PO CPDR: 60.0000 mg | DELAYED_RELEASE_CAPSULE | Freq: Every day | ORAL | 0 refills | Status: DC

## 2020-04-12 NOTE — Patient Instructions (Signed)
DASH Eating Plan DASH stands for "Dietary Approaches to Stop Hypertension." The DASH eating plan is a healthy eating plan that has been shown to reduce high blood pressure (hypertension). It may also reduce your risk for type 2 diabetes, heart disease, and stroke. The DASH eating plan may also help with weight loss. What are tips for following this plan?  General guidelines  Avoid eating more than 2,300 mg (milligrams) of salt (sodium) a day. If you have hypertension, you may need to reduce your sodium intake to 1,500 mg a day.  Limit alcohol intake to no more than 1 drink a day for nonpregnant women and 2 drinks a day for men. One drink equals 12 oz of beer, 5 oz of wine, or 1 oz of hard liquor.  Work with your health care provider to maintain a healthy body weight or to lose weight. Ask what an ideal weight is for you.  Get at least 30 minutes of exercise that causes your heart to beat faster (aerobic exercise) most days of the week. Activities may include walking, swimming, or biking.  Work with your health care provider or diet and nutrition specialist (dietitian) to adjust your eating plan to your individual calorie needs. Reading food labels   Check food labels for the amount of sodium per serving. Choose foods with less than 5 percent of the Daily Value of sodium. Generally, foods with less than 300 mg of sodium per serving fit into this eating plan.  To find whole grains, look for the word "whole" as the first word in the ingredient list. Shopping  Buy products labeled as "low-sodium" or "no salt added."  Buy fresh foods. Avoid canned foods and premade or frozen meals. Cooking  Avoid adding salt when cooking. Use salt-free seasonings or herbs instead of table salt or sea salt. Check with your health care provider or pharmacist before using salt substitutes.  Do not fry foods. Cook foods using healthy methods such as baking, boiling, grilling, and broiling instead.  Cook with  heart-healthy oils, such as olive, canola, soybean, or sunflower oil. Meal planning  Eat a balanced diet that includes: ? 5 or more servings of fruits and vegetables each day. At each meal, try to fill half of your plate with fruits and vegetables. ? Up to 6-8 servings of whole grains each day. ? Less than 6 oz of lean meat, poultry, or fish each day. A 3-oz serving of meat is about the same size as a deck of cards. One egg equals 1 oz. ? 2 servings of low-fat dairy each day. ? A serving of nuts, seeds, or beans 5 times each week. ? Heart-healthy fats. Healthy fats called Omega-3 fatty acids are found in foods such as flaxseeds and coldwater fish, like sardines, salmon, and mackerel.  Limit how much you eat of the following: ? Canned or prepackaged foods. ? Food that is high in trans fat, such as fried foods. ? Food that is high in saturated fat, such as fatty meat. ? Sweets, desserts, sugary drinks, and other foods with added sugar. ? Full-fat dairy products.  Do not salt foods before eating.  Try to eat at least 2 vegetarian meals each week.  Eat more home-cooked food and less restaurant, buffet, and fast food.  When eating at a restaurant, ask that your food be prepared with less salt or no salt, if possible. What foods are recommended? The items listed may not be a complete list. Talk with your dietitian about   what dietary choices are best for you. Grains Whole-grain or whole-wheat bread. Whole-grain or whole-wheat pasta. Brown rice. Oatmeal. Quinoa. Bulgur. Whole-grain and low-sodium cereals. Pita bread. Low-fat, low-sodium crackers. Whole-wheat flour tortillas. Vegetables Fresh or frozen vegetables (raw, steamed, roasted, or grilled). Low-sodium or reduced-sodium tomato and vegetable juice. Low-sodium or reduced-sodium tomato sauce and tomato paste. Low-sodium or reduced-sodium canned vegetables. Fruits All fresh, dried, or frozen fruit. Canned fruit in natural juice (without  added sugar). Meat and other protein foods Skinless chicken or turkey. Ground chicken or turkey. Pork with fat trimmed off. Fish and seafood. Egg whites. Dried beans, peas, or lentils. Unsalted nuts, nut butters, and seeds. Unsalted canned beans. Lean cuts of beef with fat trimmed off. Low-sodium, lean deli meat. Dairy Low-fat (1%) or fat-free (skim) milk. Fat-free, low-fat, or reduced-fat cheeses. Nonfat, low-sodium ricotta or cottage cheese. Low-fat or nonfat yogurt. Low-fat, low-sodium cheese. Fats and oils Soft margarine without trans fats. Vegetable oil. Low-fat, reduced-fat, or light mayonnaise and salad dressings (reduced-sodium). Canola, safflower, olive, soybean, and sunflower oils. Avocado. Seasoning and other foods Herbs. Spices. Seasoning mixes without salt. Unsalted popcorn and pretzels. Fat-free sweets. What foods are not recommended? The items listed may not be a complete list. Talk with your dietitian about what dietary choices are best for you. Grains Baked goods made with fat, such as croissants, muffins, or some breads. Dry pasta or rice meal packs. Vegetables Creamed or fried vegetables. Vegetables in a cheese sauce. Regular canned vegetables (not low-sodium or reduced-sodium). Regular canned tomato sauce and paste (not low-sodium or reduced-sodium). Regular tomato and vegetable juice (not low-sodium or reduced-sodium). Pickles. Olives. Fruits Canned fruit in a light or heavy syrup. Fried fruit. Fruit in cream or butter sauce. Meat and other protein foods Fatty cuts of meat. Ribs. Fried meat. Bacon. Sausage. Bologna and other processed lunch meats. Salami. Fatback. Hotdogs. Bratwurst. Salted nuts and seeds. Canned beans with added salt. Canned or smoked fish. Whole eggs or egg yolks. Chicken or turkey with skin. Dairy Whole or 2% milk, cream, and half-and-half. Whole or full-fat cream cheese. Whole-fat or sweetened yogurt. Full-fat cheese. Nondairy creamers. Whipped toppings.  Processed cheese and cheese spreads. Fats and oils Butter. Stick margarine. Lard. Shortening. Ghee. Bacon fat. Tropical oils, such as coconut, palm kernel, or palm oil. Seasoning and other foods Salted popcorn and pretzels. Onion salt, garlic salt, seasoned salt, table salt, and sea salt. Worcestershire sauce. Tartar sauce. Barbecue sauce. Teriyaki sauce. Soy sauce, including reduced-sodium. Steak sauce. Canned and packaged gravies. Fish sauce. Oyster sauce. Cocktail sauce. Horseradish that you find on the shelf. Ketchup. Mustard. Meat flavorings and tenderizers. Bouillon cubes. Hot sauce and Tabasco sauce. Premade or packaged marinades. Premade or packaged taco seasonings. Relishes. Regular salad dressings. Where to find more information:  National Heart, Lung, and Blood Institute: www.nhlbi.nih.gov  American Heart Association: www.heart.org Summary  The DASH eating plan is a healthy eating plan that has been shown to reduce high blood pressure (hypertension). It may also reduce your risk for type 2 diabetes, heart disease, and stroke.  With the DASH eating plan, you should limit salt (sodium) intake to 2,300 mg a day. If you have hypertension, you may need to reduce your sodium intake to 1,500 mg a day.  When on the DASH eating plan, aim to eat more fresh fruits and vegetables, whole grains, lean proteins, low-fat dairy, and heart-healthy fats.  Work with your health care provider or diet and nutrition specialist (dietitian) to adjust your eating plan to your   individual calorie needs. This information is not intended to replace advice given to you by your health care provider. Make sure you discuss any questions you have with your health care provider. Document Revised: 11/09/2017 Document Reviewed: 11/20/2016 Elsevier Patient Education  2020 Elsevier Inc.  

## 2020-04-12 NOTE — Progress Notes (Signed)
This visit occurred during the SARS-CoV-2 public health emergency.  Safety protocols were in place, including screening questions prior to the visit, additional usage of staff PPE, and extensive cleaning of exam room while observing appropriate contact time as indicated for disinfecting solutions.  Subjective:     Patient ID: [name] , female    DOB: [birth year] , 49 y.o.   MRN: [id number]   Chief Complaint  Patient presents with  . Establish care    HPI  She is here today to establish primary care. She was referred by her GYN, Dr. Cletis Media.  She was previously seen by Dr. Alroy Dust at [facility], she admits she has not been there "in awhile". She denies h/o high blood pressure, diabetes and heart disease. She is not having any chest pain or shortness of breath.  She admits she probably gained 20-25 pounds since the start of the [place] pandemic. 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 She does not wish to get the COVID vaccine at this time. She notes that she has never had flu vaccine and has never had the flu.     Past Medical History:  Diagnosis Date  . Bacterial infection   . H/O chlamydia infection   . Trichomonas   . Yeast infection      Family History  Problem Relation Age of Onset  . Healthy Mother   . ADD / ADHD Father      Current Outpatient Medications:  .  omeprazole (PRILOSEC) 10 MG capsule, Take 10 mg by mouth daily., Disp: , Rfl:  .  dexlansoprazole (DEXILANT) 60 MG capsule, Take 1 capsule (60 mg total) by mouth daily., Disp: 30 capsule, Rfl: 0   No Known Allergies   Review of Systems  Constitutional: Positive for unexpected weight change.       She reports gaining 20-25 pounds over the past year. She admits she is not that active- she works from home. When questioned, she does admit that her husband states she snores.   Respiratory: Negative.   Cardiovascular: Negative.   Gastrointestinal: Negative.        Reports h/o reflux.  Has been on Prilosec OTC for "many years". Despite this, she is still having sx.   Genitourinary:       She does admit to nocturia.   Neurological: Negative.   Psychiatric/Behavioral: Negative.      Today's Vitals   04/12/20 1135  BP: (!) 134/92  Pulse: 79  Temp: 98.1 F (36.7 C)  TempSrc: Oral  Weight: 289 lb 9.6 oz (131.4 kg)  Height: 5' 4.5" (1.638 m)   Body mass index is 48.94 kg/m.   Objective:  Physical Exam Vitals and nursing note reviewed.  Constitutional:      Appearance: Normal appearance. She is obese.  HENT:     Head: Normocephalic and atraumatic.     Right Ear: Ear canal and external ear normal. There is impacted cerumen.     Left Ear: Ear canal and external ear normal. There is impacted cerumen.     Ears:     Comments: Hard cerumen b/l Cardiovascular:     Rate and Rhythm: Normal rate and regular rhythm.     Heart sounds: Normal heart sounds.  Pulmonary:     Effort: Pulmonary effort is normal.     Breath sounds: Normal breath sounds.  Abdominal:     Palpations: Abdomen is soft.     Comments: Rounded, soft.   Skin:  General: Skin is warm.  Neurological:     General: No focal deficit present.     Mental Status: She is alert.  Psychiatric:        Mood and Affect: Mood normal.        Behavior: Behavior normal.         Assessment And Plan:     1. Elevated blood pressure reading  She was given information on DASH eating plan. EKG performed, NSR w/ first degree AV block, old anteroseptal infarct. She is encouraged to avoid adding salt to her foods. She agrees to RTO in 4-6 weeks for re-evaluation.   - Lipid panel - EKG  2. Weight gain  She is encouraged to avoid sugary beverages and to increase her daily activity.   Her thyroid function was checked by her GI specialist, I will request her records today. She is also encouraged to incorporate more exercise into her daily routine - aiming for at least 30 minutes five days per week.   - Insulin,  random([phone])  3. Lower extremity edema  She is encouraged to decrease her salt intake and elevate her legs when seated. This could also be related to possible OSA. She denies orthopnea at this time.   4. Vitamin D deficiency disease  I WILL CHECK A VIT D LEVEL AND SUPPLEMENT AS NEEDED.  ALSO ENCOURAGED TO SPEND 15 MINUTES IN THE SUN DAILY.  - Vitamin D (25 hydroxy)  5. Bilateral impacted cerumen  AFTER OBTAINING VERBAL CONSENT, BOTH EARS WERE FLUSHED BY IRRIGATION. SHE TOLERATED PROCEDURE WELL WITHOUT ANY COMPLICATIONS. NO TM ABNORMALITIES WERE NOTED.   6. Snoring  She agrees to Neuro referral - I believe she may have OSA. She also reports fatigue and nocturia.   - Ambulatory referral to Neurology  7. Gastroesophageal reflux disease without esophagitis  Chronic. She was given samples of Dexilant, 60mg  to take once daily IN PLACE of Prilosec OTC. She will rto in 4-6 weeks for re-evaluation. Advised to stop eating 3 hours prior to going to bed.   8. Class 3 severe obesity due to excess calories without serious comorbidity with body mass index (BMI) of 45.0 to 49.9 in adult (HCC)  BMI 48. She is encouraged to strive for BMI less than 40 to decrease cardiac risk. Again, importance of regular exercise was discussed with the patient.   9. Drug therapy  She reports long-term use of PPI therapy. I will check vitamin B12 levels today.   - Vitamin B12   , MD    THE PATIENT IS ENCOURAGED TO PRACTICE SOCIAL DISTANCING DUE TO THE COVID-19 PANDEMIC.

## 2020-04-13 ENCOUNTER — Encounter: Payer: Self-pay | Admitting: Internal Medicine

## 2020-04-13 LAB — VITAMIN B12: Vitamin B-12: 407 pg/mL (ref 232–1245)

## 2020-04-13 LAB — LIPID PANEL
Chol/HDL Ratio: 3.2 ratio (ref 0.0–4.4)
Cholesterol, Total: 175 mg/dL (ref 100–199)
HDL: 55 mg/dL (ref >39)
LDL Chol Calc (NIH): 99 mg/dL (ref 0–99)
Triglycerides: 119 mg/dL (ref 0–149)
VLDL Cholesterol Cal: 21 mg/dL (ref 5–40)

## 2020-04-13 LAB — VITAMIN D 25 HYDROXY (VIT D DEFICIENCY, FRACTURES): Vit D, 25-Hydroxy: 22.8 ng/mL — ABNORMAL LOW (ref 30.0–100.0)

## 2020-04-13 LAB — INSULIN, RANDOM: INSULIN: 12.5 u[IU]/mL (ref 2.6–24.9)

## 2020-04-16 ENCOUNTER — Other Ambulatory Visit: Payer: Self-pay

## 2020-04-16 MED ORDER — VITAMIN D (ERGOCALCIFEROL) 1.25 MG (50000 UNIT) PO CAPS: ORAL_CAPSULE | ORAL | 0 refills | Status: DC

## 2020-04-20 ENCOUNTER — Encounter: Payer: Self-pay | Admitting: Neurology

## 2020-04-20 ENCOUNTER — Ambulatory Visit (INDEPENDENT_AMBULATORY_CARE_PROVIDER_SITE_OTHER): Payer: BC Managed Care – PPO | Admitting: Neurology

## 2020-04-20 ENCOUNTER — Other Ambulatory Visit: Payer: Self-pay

## 2020-04-20 VITALS — BP 147/90 | HR 73 | Temp 97.1°F | Ht 65.0 in | Wt 289.0 lb

## 2020-04-20 DIAGNOSIS — G4719 Other hypersomnia: Secondary | ICD-10-CM

## 2020-04-20 DIAGNOSIS — M25472 Effusion, left ankle: Secondary | ICD-10-CM

## 2020-04-20 DIAGNOSIS — I10 Essential (primary) hypertension: Secondary | ICD-10-CM

## 2020-04-20 DIAGNOSIS — N951 Menopausal and female climacteric states: Secondary | ICD-10-CM

## 2020-04-20 DIAGNOSIS — R351 Nocturia: Secondary | ICD-10-CM | POA: Diagnosis not present

## 2020-04-20 DIAGNOSIS — R0683 Snoring: Secondary | ICD-10-CM

## 2020-04-20 DIAGNOSIS — M25471 Effusion, right ankle: Secondary | ICD-10-CM

## 2020-04-20 DIAGNOSIS — E66813 Obesity, class 3: Secondary | ICD-10-CM | POA: Insufficient documentation

## 2020-04-20 NOTE — Patient Instructions (Signed)
Obesity Hypoventilation Syndrome  Obesity hypoventilation syndrome (OHS) means that you are not breathing well enough to get air in and out of your lungs efficiently (ventilation). This causes a low oxygen level and a high carbon dioxide level in your blood (hypoventilation). Having too much total body fat (obesity) is a significant risk factor for developing OHS. OHS makes it harder for your heart to pump oxygen-rich blood to your body. It can cause sleep disturbances and make you feel sleepy during the day. Over time, OHS can increase your risk for:  Heart disease.  High blood pressure (hypertension).  Reduced ability to absorb sugar from the bloodstream (insulin resistance).  Heart failure. Over time, OHS weakens your heart and can lead to heart failure. What are the causes? The exact cause of OHS is not known. Possible causes include:  Pressure on the lungs from excess body weight.  Obesity-related changes in how much air the lungs can hold (lung capacity) and how much they can expand (lung compliance).  Failure of the brain to regulate oxygen and carbon dioxide levels properly.  Chemicals (hormones) produced by excess fat cells interfering with breathing regulation.  A breathing condition in which breathing pauses or becomes shallow during sleep (sleep apnea). This condition can eventually cause the body to ventilate poorly and to hold onto carbon dioxide during the day. What increases the risk? You may have a greater risk for OHS if you:  Have a BMI of 30 or higher. BMI is an estimate of body fat that is calculated from height and weight. For adults, a BMI of 30 or higher is considered obese.  Are 40?50 years old.  Carry most of your excess weight around your waist.  Experience moderate symptoms of sleep apnea. What are the signs or symptoms? The most common symptoms of OHS are:  Daytime sleepiness.  Lack of energy.  Shortness of breath.  Morning headaches.  Sleep  apnea.  Trouble concentrating.  Irritability, mood swings, or depression.  Swollen veins in the neck.  Swelling of the legs. How is this diagnosed? Your health care provider may suspect OHS if you are obese and have poor breathing during the day and at night. Your health care provider will also do a physical exam. You may have tests to:  Measure your BMI.  Measure your blood oxygen level with a sensor placed on your finger (pulse oximetry).  Measure blood oxygen and carbon dioxide in a blood sample.  Measure the amount of red blood cells in a blood sample. OHS causes the number of red blood cells you have to increase (polycythemia).  Check your breathing ability (pulmonary function testing).  Check your breathing ability, breathing patterns, and oxygen level while you sleep (sleep study). You may also have a chest X-ray to rule out other breathing problems. You may have an electrocardiogram (ECG) and or echocardiogram to check for signs of heart failure. How is this treated? Weight loss is the most important part of treatment for OHS, and it may be the only treatment that you need. Other treatments may include:  Using a device to open your airway while you sleep, such as a continuous positive airway pressure (CPAP) machine that delivers oxygen to your airway through a mask.  Surgery (gastric bypass surgery) to lower your BMI. This may be needed if: ? You are very obese. ? Other treatments have not worked for you. ? Your OHS is very severe and is causing organ damage, such as heart failure. Follow these   instructions at home:  Medicines  Take over-the-counter and prescription medicines only as told by your health care provider.  Ask your health care provider what medicines are safe for you. You may be told to avoid medicines that can impair breathing and make OHS worse, such as sedatives and narcotics. Sleeping habits  If you are prescribed a CPAP machine, make sure you  understand and use the machine as directed.  Try to get 8 hours of sleep every night.  Go to bed at the same time every night, and get up at the same time every day. General instructions  Work with your health care provider to make a diet and exercise plan that helps you reach and maintain a healthy weight.  Eat a healthy diet.  Avoid smoking.  Exercise regularly as told by your health care provider.  During the evening, do not drink caffeine and do not eat heavy meals.  Keep all follow-up visits as told by your health care provider. This is important. Contact a health care provider if:  You experience new or worsening shortness of breath.  You have chest pain.  You have an irregular heartbeat (palpitations).  You have dizziness.  You faint.  You develop a cough.  You have a fever.  You have chest pain when you breathe (pleurisy). This information is not intended to replace advice given to you by your health care provider. Make sure you discuss any questions you have with your health care provider. Document Revised: 03/21/2019 Document Reviewed: 05/08/2016 Elsevier Patient Education  2020 Elsevier Inc.  

## 2020-04-20 NOTE — Progress Notes (Addendum)
SLEEP MEDICINE CLINIC    Provider:  Melvyn Novas, MD  Primary Care Physician:  Dorothyann Peng, MD [address] [place] [address]     Referring Provider: Dorothyann Peng, Md [address] [place],  [place] [address]          Chief Complaint according to patient   Patient presents with:    . New Patient (Initial Visit)     PCP referring.  Patient states that she doesn't feel much concern related to sleep. she does mention snoring and  if she forgets to take her acid reflux medications she will wake up due to gasping for air because of reflux. She snores.  never had a SS. does admit she has hard time with waking up in morning.       HISTORY OF PRESENT ILLNESS:  [name] is a 49 year old African- American female patient and seen here as a referral on 04/20/2020 for a sleep evaluation.  Chief concern according to patient : "I snore and sometimes wake up related to to GERD".    I have the pleasure of seeing [name] on 04-20-2020, a right -handed Black or [place] American female with a possible sleep disorder.  She has a history of weight gain and BMI is 48 kg/m2, she presents with HTN and vit D deficiency.    Sleep relevant medical history: very frequent Nocturia, no Tonsillectomy, no deviated septum repair.    Family medical /sleep history: No family with OSA,  Sister has hypersomnia.    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  She raises her nephew, 80 years old.  The patient currently works regular 8-5. Pets are not present. Tobacco use- none . ETOH use : not at all , Caffeine intake in form of Coffee( 2 in AM ) Soda( rare ) Tea ( once a week) no energy drinks. Regular exercise in form of home gym.  Hobbies : Church .    Sleep habits are as follows: The patient's dinner time is between 5-6  PM.  The patient goes to bed  at 10 PM and she has some trouble to go to sleep- TV  In the bedroom- she continues to sleep for 2-3 hours, wakes for 3-4 bathroom breaks.   The preferred sleep position is on her side , with the support of 1 pillows.  Dreams are reportedly frequent -7.30 AM is the usual rise time. The patient wakes up spontaneously at 6.30.  She reports not feeling refreshed or restored in AM, with symptoms such as dry mouth but no, morning headaches, and residual fatigue. Averages 4-5 hours of nocturnal sleep.   Naps are taken frequently, lasting from 30-50 minutes and are less refreshing .    Review of Systems: Out of a complete 14 system review, the patient complains of only the following symptoms, and all other reviewed systems are negative.:  Fatigue, sleepiness , snoring, fragmented sleep, Insomnia with Nocturia.    How likely are you to doze in the following situations: 0 = not likely, 1 = slight chance, 2 = moderate chance, 3 = high chance   Sitting and Reading? Watching Television? Sitting inactive in a public place (theater or meeting)? As a passenger in a car for an hour without a break? Lying down in the afternoon when circumstances permit? Sitting and  talking to someone? Sitting quietly after lunch without alcohol? In a car, while stopped for a few minutes in traffic?   Total = 6/ 24 points   FSS endorsed at 18/ 63 points.   Social History   Socioeconomic History  . Marital status: Married    Spouse name: Not on file  . Number of children: Raises a cousin 33, and her nephew, age 79 ( 04/2020) .   Marland Kitchen Years of education: Not on file  . Highest education level: Not on file  Occupational History  . Not on file  Tobacco Use  . Smoking status: Never Smoker  . Smokeless tobacco: Never Used  Substance and Sexual Activity  . Alcohol use: No  . Drug use: No  . Sexual activity: Yes    Comment: none blocked tubes  Other Topics Concern  . Not on file  Social History Narrative  . Not on  file   Social Determinants of Health   Financial Resource Strain:   . Difficulty of Paying Living Expenses:   Food Insecurity:   . Worried About Charity fundraiser in the Last Year:   . Arboriculturist in the Last Year:   Transportation Needs:   . Film/video editor (Medical):   Marland Kitchen Lack of Transportation (Non-Medical):   Physical Activity:   . Days of Exercise per Week:   . Minutes of Exercise per Session:   Stress:   . Feeling of Stress :   Social Connections:   . Frequency of Communication with Friends and Family:   . Frequency of Social Gatherings with Friends and Family:   . Attends Religious Services:   . Active Member of Clubs or Organizations:   . Attends Archivist Meetings:   Marland Kitchen Marital Status:     Family History  Problem Relation Age of Onset  . Healthy Mother   . ADD / ADHD Father     Past Medical History:  Diagnosis Date  . Bacterial infection   . H/O chlamydia infection   . Trichomonas   . Yeast infection     Past Surgical History:  Procedure Laterality Date  . MYOMECTOMY  2009  . TUBOPLASTY / TUBOTUBAL ANASTOMOSIS  2009  . UMBILICAL HERNIA REPAIR  1973     Current Outpatient Medications on File Prior to Visit  Medication Sig Dispense Refill  . dexlansoprazole (DEXILANT) 60 MG capsule Take 1 capsule (60 mg total) by mouth daily. 30 capsule 0  . omeprazole (PRILOSEC) 10 MG capsule Take 10 mg by mouth daily.    . Vitamin D, Ergocalciferol, (DRISDOL) 1.25 MG (50000 UNIT) CAPS capsule Take 1 capsule by mouth on Tuesday and Friday 24 capsule 0   No current facility-administered medications on file prior to visit.    No Known Allergies  Physical exam:  Today's Vitals   04/20/20 1301  BP: (!) 147/90  Pulse: 73  Temp: (!) 97.1 F (36.2 C)  Weight: 289 lb (131.1 kg)  Height: 5\' 5"  (1.651 m)   Body mass index is 48.09 kg/m.   Wt Readings from Last 3 Encounters:  04/20/20 289 lb (131.1 kg)  04/12/20 289 lb 9.6 oz (131.4 kg)    10/16/12 243 lb (110.2 kg)     Ht Readings from Last 3 Encounters:  04/20/20 5\' 5"  (1.651 m)  04/12/20 5' 4.5" (1.638 m)      General: The patient is awake, alert and appears not in acute distress. The patient is well groomed.  Head: Normocephalic, atraumatic. Neck is supple. Mallampati 2 ,  neck circumference:16.5  inches . Nasal airflow  patent.  Retrognathia is mild seen.  Dental status: intact  Cardiovascular:  Regular rate and cardiac rhythm by pulse,  without distended neck veins. Respiratory: Lungs are clear to auscultation.  Skin:  With evidence of ankle edema,but no rash. Trunk: The patient's posture is erect.   Neurologic exam : The patient is awake and alert, oriented to place and time.   Memory subjective described as intact.  Attention span & concentration ability appears normal.  Speech is fluent,  without  dysarthria, dysphonia or aphasia.  Mood and affect are appropriate.   Cranial nerves: no loss of smell or taste reported  Pupils are equal and briskly reactive to light. Funduscopic exam deferred.  Extraocular movements in vertical and horizontal planes were intact and without nystagmus. No Diplopia. Visual fields by finger perimetry are intact. Hearing was intact to soft voice and finger rubbing.  Facial sensation intact to fine touch. Facial motor strength is symmetric and tongue and uvula move midline.  Neck ROM : rotation, tilt and flexion extension were normal for age and shoulder shrug was symmetrical.  Motor exam:  Symmetric bulk, tone and ROM.   Normal tone , symmetric grip strength .  Sensory:  Fine touch and vibration were normal.  Coordination: Rapid alternating movements in the fingers/hands were of normal speed.  The Finger-to-nose maneuver without evidence of ataxia, dysmetria or tremor. Gait and station: Patient could rise unassisted from a seated position, walked without assistive device. Stance is of normal width/ base . Toe and heel walk were  deferred.  Deep tendon reflexes: in the  upper and lower extremities are symmetric and intact.  Babinski response was deferred.   There has been an increase in weight over the pandemic and increase in daytime fatigue and Nocturia, she snores.      After spending a total time of 40 minutes face to face and additional time for physical and neurologic examination, review of laboratory studies,  personal review of imaging studies, reports and results of other testing and review of referral information / records as far as provided in visit, I have established the following assessments:  1)  Snoring , BMI at 48.09 and narrow upper airway all indicate a high probability of OSA.  2)  Patient is never rested, wakes up with a dry mouth. 3)  Nocturia times 4 in a patient not beingon diuretics. 4)  some RLS, not every night.    My Plan is to proceed with:  1) HST , to screen for apnea.  If positive , we can discuss treatment options or initiate auto CPAP.      I would like to thank Dorothyann Peng, MD for allowing me to meet with and to take care of this pleasant patient.   In short, Tamela DIVINE IMBER will follow up either personally or through our NP within 2-3 month.    Electronically signed by: Melvyn Novas, MD 04/20/2020 1:05 PM  [facility] Board certified by The [employer] of Sleep Medicine and Diplomate of the [employer] of Sleep Medicine. Board certified In Neurology through the ABPN, Fellow of the [employer] of Neurology. Medical Director of [facility].

## 2020-05-26 ENCOUNTER — Ambulatory Visit (INDEPENDENT_AMBULATORY_CARE_PROVIDER_SITE_OTHER): Payer: BC Managed Care – PPO | Admitting: Internal Medicine

## 2020-05-26 ENCOUNTER — Other Ambulatory Visit: Payer: Self-pay

## 2020-05-26 ENCOUNTER — Encounter: Payer: Self-pay | Admitting: Internal Medicine

## 2020-05-26 VITALS — BP 146/90 | HR 76 | Temp 97.7°F | Ht 65.0 in | Wt 291.4 lb

## 2020-05-26 DIAGNOSIS — I10 Essential (primary) hypertension: Secondary | ICD-10-CM

## 2020-05-26 DIAGNOSIS — Z79899 Other long term (current) drug therapy: Secondary | ICD-10-CM | POA: Diagnosis not present

## 2020-05-26 DIAGNOSIS — K219 Gastro-esophageal reflux disease without esophagitis: Secondary | ICD-10-CM

## 2020-05-26 MED ORDER — DEXILANT 60 MG PO CPDR: 60.0000 mg | DELAYED_RELEASE_CAPSULE | Freq: Every day | ORAL | 5 refills | Status: DC

## 2020-05-26 MED ORDER — VALSARTAN-HYDROCHLOROTHIAZIDE 80-12.5 MG PO TABS
1.0000 | ORAL_TABLET | Freq: Every day | ORAL | 1 refills | Status: DC
Start: 2020-05-26 — End: 2020-06-03

## 2020-05-27 NOTE — Progress Notes (Signed)
  This visit occurred during the SARS-CoV-2 public health emergency.  Safety protocols were in place, including screening questions prior to the visit, additional usage of staff PPE, and extensive cleaning of exam room while observing appropriate contact time as indicated for disinfecting solutions.  Subjective:     Patient ID: [name] , female    DOB: 11/29/1971 , 49 y.o.   MRN: [id number]   Chief Complaint  Patient presents with  . Hypertension    HPI  She is here today for BP check, she had elevated BP reading at her last visit. She was started on valsartan/hctz.  She denies h/o high blood pressure. She denies headaches, chest pain and visual disturbance.     Past Medical History:  Diagnosis Date  . Bacterial infection   . H/O chlamydia infection   . Trichomonas   . Yeast infection      Family History  Problem Relation Age of Onset  . Healthy Mother   . ADD / ADHD Father      Current Outpatient Medications:  Marland Kitchen  Vitamin D, Ergocalciferol, (DRISDOL) 1.25 MG (50000 UNIT) CAPS capsule, Take 1 capsule by mouth on Tuesday and Friday, Disp: 24 capsule, Rfl: 0 .  dexlansoprazole (DEXILANT) 60 MG capsule, Take 1 capsule (60 mg total) by mouth daily., Disp: 30 capsule, Rfl: 5 .  valsartan-hydrochlorothiazide (DIOVAN HCT) 80-12.5 MG tablet, Take 1 tablet by mouth daily., Disp: 30 tablet, Rfl: 1   No Known Allergies   Review of Systems  Constitutional: Negative.   Respiratory: Negative.   Cardiovascular: Negative.   Gastrointestinal: Negative.        She c/u heartburn/reflux   Neurological: Negative.   Psychiatric/Behavioral: Negative.      Today's Vitals   05/26/20 1610  BP: (!) 146/90  Pulse: 76  Temp: 97.7 F (36.5 C)  TempSrc: Oral  Weight: 291 lb 6.4 oz (132.2 kg)  Height: _0  (1.651 m)   Body mass index is 48.49 kg/m.   Objective:  Physical Exam Vitals and nursing note reviewed.  Constitutional:      Appearance: Normal appearance. She is obese.   HENT:     Head: Normocephalic and atraumatic.  Cardiovascular:     Rate and Rhythm: Normal rate and regular rhythm.     Heart sounds: Normal heart sounds.  Pulmonary:     Effort: Pulmonary effort is normal.     Breath sounds: Normal breath sounds.  Skin:    General: Skin is warm.  Neurological:     General: No focal deficit present.     Mental Status: She is alert.  Psychiatric:        Mood and Affect: Mood normal.        Behavior: Behavior normal.         Assessment And Plan:     1. HTN (hypertension), benign  Newly diagnosed. I will start her on valsartan/hct 80/12.78m once daily.  Possible side effects were discussed with the patient. She is encouraged to avoid adding salt to her foods. She will rto in 2 weeks for BP check. I will check Bmp at that time.   2. Gastroesophageal reflux disease without esophagitis  She was given samples of Dexilant, 661mto take once daily.   3. Drug therapy  - BMP8+EGFR; Future   RoMaximino GreenlandMD    THE PATIENT IS ENCOURAGED TO PRACTICE SOCIAL DISTANCING DUE TO THE COVID-19 PANDEMIC.

## 2020-05-31 ENCOUNTER — Ambulatory Visit (INDEPENDENT_AMBULATORY_CARE_PROVIDER_SITE_OTHER): Payer: BC Managed Care – PPO | Admitting: Neurology

## 2020-05-31 ENCOUNTER — Other Ambulatory Visit: Payer: Self-pay

## 2020-05-31 DIAGNOSIS — R0683 Snoring: Secondary | ICD-10-CM

## 2020-05-31 DIAGNOSIS — N951 Menopausal and female climacteric states: Secondary | ICD-10-CM

## 2020-05-31 DIAGNOSIS — G4710 Hypersomnia, unspecified: Secondary | ICD-10-CM

## 2020-05-31 DIAGNOSIS — R351 Nocturia: Secondary | ICD-10-CM

## 2020-05-31 DIAGNOSIS — G4719 Other hypersomnia: Secondary | ICD-10-CM

## 2020-05-31 DIAGNOSIS — I10 Essential (primary) hypertension: Secondary | ICD-10-CM

## 2020-06-02 ENCOUNTER — Ambulatory Visit: Payer: BC Managed Care – PPO

## 2020-06-02 ENCOUNTER — Other Ambulatory Visit: Payer: Self-pay

## 2020-06-02 ENCOUNTER — Other Ambulatory Visit: Payer: BC Managed Care – PPO

## 2020-06-02 DIAGNOSIS — Z79899 Other long term (current) drug therapy: Secondary | ICD-10-CM

## 2020-06-03 ENCOUNTER — Ambulatory Visit: Payer: BC Managed Care – PPO

## 2020-06-03 ENCOUNTER — Other Ambulatory Visit: Payer: Self-pay

## 2020-06-03 VITALS — BP 140/80 | HR 79 | Temp 98.4°F | Ht 64.6 in | Wt 288.8 lb

## 2020-06-03 DIAGNOSIS — I10 Essential (primary) hypertension: Secondary | ICD-10-CM

## 2020-06-03 LAB — BMP8+EGFR
BUN/Creatinine Ratio: 11 (ref 9–23)
BUN: 12 mg/dL (ref 6–24)
CO2: 22 mmol/L (ref 20–29)
Calcium: 10.3 mg/dL — ABNORMAL HIGH (ref 8.7–10.2)
Chloride: 105 mmol/L (ref 96–106)
Creatinine, Ser: 1.07 mg/dL — ABNORMAL HIGH (ref 0.57–1.00)
GFR calc Af Amer: 70 mL/min/{1.73_m2} (ref >59)
GFR calc non Af Amer: 61 mL/min/{1.73_m2} (ref >59)
Glucose: 104 mg/dL — ABNORMAL HIGH (ref 65–99)
Potassium: 4.5 mmol/L (ref 3.5–5.2)
Sodium: 139 mmol/L (ref 134–144)

## 2020-06-03 MED ORDER — VALSARTAN-HYDROCHLOROTHIAZIDE 160-12.5 MG PO TABS
1.0000 | ORAL_TABLET | Freq: Every day | ORAL | 11 refills | Status: DC
Start: 2020-06-03 — End: 2021-02-24

## 2020-06-03 NOTE — Progress Notes (Signed)
Pt is here today for a blood pressure check. Pt was started on valsartan/ hctz 80-12.5 at last visit on 05/26/20. Pt states that she feels better since being on medication. Provider advises pt to increase to 160-12.5. pt will return in 4 weeks

## 2020-06-07 DIAGNOSIS — Z1211 Encounter for screening for malignant neoplasm of colon: Secondary | ICD-10-CM | POA: Diagnosis not present

## 2020-06-07 LAB — HM COLONOSCOPY

## 2020-06-16 ENCOUNTER — Telehealth: Payer: Self-pay | Admitting: Neurology

## 2020-06-16 NOTE — Telephone Encounter (Signed)
-----   Message from Melvyn Novas, MD sent at 06/16/2020 12:15 PM EDT ----- IMPRESSION: Total Apnea/Hypopnea Index (AHI):  32.1/h; RDI:  34.2/h; REM AHI: 63.9 /h Strong REM dependent moderate- severe OSA noted without prolonged hypoxia and without tachy-bradycardia.  RECOMMENDATION: auto CPAP device with heated humidity and mask of choice - and patient's comfort. Setting from 6-18 cm water with 2 cm EPR.

## 2020-06-16 NOTE — Progress Notes (Signed)
IMPRESSION: Total Apnea/Hypopnea Index (AHI):  32.1/h; RDI:  34.2/h; REM AHI: 63.9 /h Strong REM dependent moderate- severe OSA noted without prolonged hypoxia and without tachy-bradycardia.  RECOMMENDATION: auto CPAP device with heated humidity and mask of choice - and patient's comfort. Setting from 6-18 cm water with 2 cm EPR.

## 2020-06-16 NOTE — Telephone Encounter (Signed)
I called pt. I advised pt that Dr. Vickey Huger reviewed their sleep study results and found that pt has severe sleep apnea. Dr. Vickey Huger recommends that pt starts auto CPAP. I reviewed PAP compliance expectations with the pt. Pt is agreeable to starting a CPAP. I advised pt that an order will be sent to a DME, [facility] and [facility] will call the pt within about one week after they file with the pt's insurance. [facility]) will show the pt how to use the machine, fit for masks, and troubleshoot the CPAP if needed. A follow up appt was made for insurance purposes with Butch Penny, NP on sept 2,2021 at 11:30 am. Pt verbalized understanding to arrive 15 minutes early and bring their CPAP. A letter with all of this information in it will be mailed to the pt as a reminder. I verified with the pt that the address we have on file is correct. Pt verbalized understanding of results. Pt had no questions at this time but was encouraged to call back if questions arise. I have sent the order to [facility] and have received confirmation that they have received the order.

## 2020-06-16 NOTE — Addendum Note (Signed)
Addended by: Melvyn Novas on: 06/16/2020 12:15 PM   Modules accepted: Orders

## 2020-06-16 NOTE — Procedures (Signed)
[facility] [address] [place], [place] NAME:  [name]                                                                 DOB:[birth year] MEDICAL RECORD No:  10/07/1971                                               DOS: 06/01/2020 REFERRING PHYSICIAN: 06/03/2020, MD STUDY PERFORMED: HST on [facility] HISTORY: [name] is a 49 year -old African- American female patient and seen here as a referral on 04/20/2020 for a sleep evaluation.  Chief concern: "I snore and sometimes wake up related to acid reflux/ GERD".  [name] has a history of weight gain and BMI is 48 kg/m2, she presents with HTN and vit D deficiency. Sleep relevant medical history: very frequent Nocturia   Epworth Sleepiness score of 18/24 points, BMI 48.1kg/m2 STUDY RESULTS:   Total Recording Time: 8 h, [name]; Total Sleep Time:   7 h, Total Apnea/Hypopnea Index (AHI):  32.1/h; RDI:  34.2/h; REM AHI: 63.9 /h Average Oxygen Saturation:  93 %; Lowest Oxygen Desaturation:  77 %  Total Time Oxygen Saturation Below or at 88 %:4.2 minutes  Average Heart Rate: 83 bpm (between 54 and 120 bpm). IMPRESSION: Total Apnea/Hypopnea Index (AHI):  32.1/h; RDI:  34.2/h; REM AHI: 63.9 /h Strong REM dependent moderate- severe OSA noted without prolonged hypoxia and without tachy-bradycardia.  RECOMMENDATION: auto CPAP device with heated humidity and mask of choice - and patient's comfort. Setting from 6-18 cm water with 2 cm EPR.  I certify that I have reviewed the raw data recording prior to the issuance of this report in accordance with the standards of the American Academy of Sleep Medicine (AASM). , M.D.    Medical Director of [facility], accredited by the AASM. Diplomat of the ABPN and ABSM.

## 2020-06-28 ENCOUNTER — Other Ambulatory Visit: Payer: Self-pay | Admitting: Internal Medicine

## 2020-06-28 ENCOUNTER — Ambulatory Visit (INDEPENDENT_AMBULATORY_CARE_PROVIDER_SITE_OTHER): Payer: BC Managed Care – PPO | Admitting: Internal Medicine

## 2020-06-28 ENCOUNTER — Other Ambulatory Visit: Payer: Self-pay

## 2020-06-28 ENCOUNTER — Encounter: Payer: Self-pay | Admitting: Internal Medicine

## 2020-06-28 VITALS — BP 124/82 | HR 63 | Temp 98.0°F | Ht 64.6 in | Wt 290.6 lb

## 2020-06-28 DIAGNOSIS — Z6841 Body Mass Index (BMI) 40.0 and over, adult: Secondary | ICD-10-CM

## 2020-06-28 DIAGNOSIS — I10 Essential (primary) hypertension: Secondary | ICD-10-CM

## 2020-06-28 DIAGNOSIS — Z79899 Other long term (current) drug therapy: Secondary | ICD-10-CM | POA: Diagnosis not present

## 2020-06-28 NOTE — Progress Notes (Signed)
I,[name],acting as a Neurosurgeon for Gwynneth Aliment, MD.,have documented all relevant documentation on the behalf of Gwynneth Aliment, MD,as directed by  Gwynneth Aliment, MD while in the presence of Gwynneth Aliment, MD.  This visit occurred during the SARS-CoV-2 public health emergency.  Safety protocols were in place, including screening questions prior to the visit, additional usage of staff PPE, and extensive cleaning of exam room while observing appropriate contact time as indicated for disinfecting solutions.  Subjective:     Patient ID: [name] , female    DOB: 09-23-1971 , 49 y.o.   MRN: [id number]   Chief Complaint  Patient presents with  . Hypertension    HPI  The patient is here today for a follow-up on her blood pressure.  The dose of valsartan/hct was increased to 160/12.5mg  once daily. She has not had any issues with the medication.   Hypertension This is a chronic problem. The current episode started more than 1 month ago. The problem has been gradually improving since onset. Risk factors for coronary artery disease include obesity. Past treatments include angiotensin blockers and diuretics. The current treatment provides moderate improvement.     Past Medical History:  Diagnosis Date  . Bacterial infection   . H/O chlamydia infection   . Trichomonas   . Yeast infection      Family History  Problem Relation Age of Onset  . Healthy Mother   . ADD / ADHD Father      Current Outpatient Medications:  .  dexlansoprazole (DEXILANT) 60 MG capsule, Take 1 capsule (60 mg total) by mouth daily., Disp: 30 capsule, Rfl: 5 .  valsartan-hydrochlorothiazide (DIOVAN HCT) 160-12.5 MG tablet, Take 1 tablet by mouth daily., Disp: 30 tablet, Rfl: 11 .  Vitamin D, Ergocalciferol, (DRISDOL) 1.25 MG (50000 UNIT) CAPS capsule, Take 1 capsule by mouth on Tuesday and Friday, Disp: 24 capsule, Rfl: 0   No Known Allergies   Review of Systems  Constitutional: Negative.    Respiratory: Negative.   Cardiovascular: Negative.   Gastrointestinal: Negative.   Psychiatric/Behavioral: Negative.   All other systems reviewed and are negative.    Today's Vitals   06/28/20 1659  BP: 124/82  Pulse: 63  Temp: 98 F (36.7 C)  TempSrc: Oral  Weight: 290 lb 9.6 oz (131.8 kg)  Height: 5' 4.6" (1.641 m)   Body mass index is 48.96 kg/m.  Wt Readings from Last 3 Encounters:  06/28/20 290 lb 9.6 oz (131.8 kg)  06/03/20 288 lb 12.8 oz (131 kg)  05/26/20 291 lb 6.4 oz (132.2 kg)   Objective:  Physical Exam Vitals and nursing note reviewed.  Constitutional:      Appearance: Normal appearance. She is obese.  HENT:     Head: Normocephalic and atraumatic.  Cardiovascular:     Rate and Rhythm: Normal rate and regular rhythm.     Heart sounds: Normal heart sounds.  Pulmonary:     Breath sounds: Normal breath sounds.  Skin:    General: Skin is warm.  Neurological:     General: No focal deficit present.     Mental Status: She is alert and oriented to person, place, and time.         Assessment And Plan:     1. Essential hypertension, benign  Well controlled. She will continue with current meds. She is encouraged to avoid adding salt to her foods. I will check renal function today.   2. Class 3 severe obesity  due to excess calories with serious comorbidity and body mass index (BMI) of 40.0 to 44.9 in adult [facility])  We discussed the use of Wegovy to address obesity. She confirms there is no personal/family h/o thyroid cancer. She was instructed on how to administer the medication. Possible side effects including nausea, constipation and diarrhea were discussed with the patient. Its association with medullary thyroid carcinoma was also discussed. She is reminded to stop eating when full. She will start with 0.25mg  x 4 weeks, then 0.5mg  x 4 weeks, then 1mg  x 4 weeks. All questions were answered to her satisfaction. She is in agreement with her treatment plan. She  will rto in four to six weeks for re-evaluation.   Patient was given opportunity to ask questions. Patient verbalized understanding of the plan and was able to repeat key elements of the plan. All questions were answered to their satisfaction.  , MD   I, Gwynneth Aliment, MD, have reviewed all documentation for this visit. The documentation on 06/28/20 for the exam, diagnosis, procedures, and orders are all accurate and complete.  THE PATIENT IS ENCOURAGED TO PRACTICE SOCIAL DISTANCING DUE TO THE COVID-19 PANDEMIC.

## 2020-06-29 ENCOUNTER — Other Ambulatory Visit: Payer: Self-pay

## 2020-06-29 LAB — BMP8+EGFR
BUN/Creatinine Ratio: 11 (ref 9–23)
BUN: 11 mg/dL (ref 6–24)
CO2: 23 mmol/L (ref 20–29)
Calcium: 10.6 mg/dL — ABNORMAL HIGH (ref 8.7–10.2)
Chloride: 103 mmol/L (ref 96–106)
Creatinine, Ser: 1.03 mg/dL — ABNORMAL HIGH (ref 0.57–1.00)
GFR calc Af Amer: 74 mL/min/{1.73_m2} (ref >59)
GFR calc non Af Amer: 64 mL/min/{1.73_m2} (ref >59)
Glucose: 89 mg/dL (ref 65–99)
Potassium: 4.3 mmol/L (ref 3.5–5.2)
Sodium: 140 mmol/L (ref 134–144)

## 2020-06-29 MED ORDER — WEGOVY 0.5 MG/0.5ML ~~LOC~~ SOAJ: 0.5000 mg | SUBCUTANEOUS | 0 refills | Status: DC

## 2020-07-13 ENCOUNTER — Ambulatory Visit: Payer: BC Managed Care – PPO | Admitting: Internal Medicine

## 2020-07-15 NOTE — Telephone Encounter (Signed)
Received a notification from [facility] that the patient is not starting the machine.  "Just letting you know that I call this patient and went over her financials with her and she said that she would like to hold off till possibly the 1st of year. She said that is when she gets a bonus. As of right now she cannot afford the machine. I did offer her a payment plan and she still refused. I have voided this order."

## 2020-08-11 ENCOUNTER — Other Ambulatory Visit: Payer: Self-pay

## 2020-08-11 ENCOUNTER — Ambulatory Visit (INDEPENDENT_AMBULATORY_CARE_PROVIDER_SITE_OTHER): Payer: BC Managed Care – PPO | Admitting: Internal Medicine

## 2020-08-11 VITALS — BP 140/78 | HR 78 | Temp 98.2°F | Ht 64.4 in | Wt 288.8 lb

## 2020-08-11 DIAGNOSIS — Z6841 Body Mass Index (BMI) 40.0 and over, adult: Secondary | ICD-10-CM

## 2020-08-11 DIAGNOSIS — I10 Essential (primary) hypertension: Secondary | ICD-10-CM

## 2020-08-11 DIAGNOSIS — L918 Other hypertrophic disorders of the skin: Secondary | ICD-10-CM

## 2020-08-11 DIAGNOSIS — Z2821 Immunization not carried out because of patient refusal: Secondary | ICD-10-CM | POA: Diagnosis not present

## 2020-08-11 MED ORDER — VITAMIN D (ERGOCALCIFEROL) 1.25 MG (50000 UNIT) PO CAPS: ORAL_CAPSULE | ORAL | 0 refills | Status: DC

## 2020-08-11 MED ORDER — DEXILANT 60 MG PO CPDR: 60.0000 mg | DELAYED_RELEASE_CAPSULE | Freq: Every day | ORAL | 5 refills | Status: DC

## 2020-08-11 MED ORDER — WEGOVY 1 MG/0.5ML ~~LOC~~ SOAJ: 1.0000 mg | SUBCUTANEOUS | 0 refills | Status: DC

## 2020-08-11 NOTE — Patient Instructions (Signed)
Obesity, Adult Obesity is the condition of having too much total body fat. Being overweight or obese means that your weight is greater than what is considered healthy for your body size. Obesity is determined by a measurement called BMI. BMI is an estimate of body fat and is calculated from height and weight. For adults, a BMI of 30 or higher is considered obese. Obesity can lead to other health concerns and major illnesses, including:  Stroke.  Coronary artery disease (CAD).  Type 2 diabetes.  Some types of cancer, including cancers of the colon, breast, uterus, and gallbladder.  Osteoarthritis.  High blood pressure (hypertension).  High cholesterol.  Sleep apnea.  Gallbladder stones.  Infertility problems. What are the causes? Common causes of this condition include:  Eating daily meals that are high in calories, sugar, and fat.  Being born with genes that may make you more likely to become obese.  Having a medical condition that causes obesity, including: ? Hypothyroidism. ? Polycystic ovarian syndrome (PCOS). ? Binge-eating disorder. ? Cushing syndrome.  Taking certain medicines, such as steroids, antidepressants, and seizure medicines.  Not being physically active (sedentary lifestyle).  Not getting enough sleep.  Drinking high amounts of sugar-sweetened beverages, such as soft drinks. What increases the risk? The following factors may make you more likely to develop this condition:  Having a family history of obesity.  Being a woman of African American descent.  Being a man of Hispanic descent.  Living in an area with limited access to: ? Parks, recreation centers, or sidewalks. ? Healthy food choices, such as grocery stores and farmers' markets. What are the signs or symptoms? The main sign of this condition is having too much body fat. How is this diagnosed? This condition is diagnosed based on:  Your BMI. If you are an adult with a BMI of 30 or  higher, you are considered obese.  Your waist circumference. This measures the distance around your waistline.  Your skinfold thickness. Your health care provider may gently pinch a fold of your skin and measure it. You may have other tests to check for underlying conditions. How is this treated? Treatment for this condition often includes changing your lifestyle. Treatment may include some or all of the following:  Dietary changes. This may include developing a healthy meal plan.  Regular physical activity. This may include activity that causes your heart to beat faster (aerobic exercise) and strength training. Work with your health care provider to design an exercise program that works for you.  Medicine to help you lose weight if you are unable to lose 1 pound a week after 6 weeks of healthy eating and more physical activity.  Treating conditions that cause the obesity (underlying conditions).  Surgery. Surgical options may include gastric banding and gastric bypass. Surgery may be done if: ? Other treatments have not helped to improve your condition. ? You have a BMI of 40 or higher. ? You have life-threatening health problems related to obesity. Follow these instructions at home: Eating and drinking   Follow recommendations from your health care provider about what you eat and drink. Your health care provider may advise you to: ? Limit fast food, sweets, and processed snack foods. ? Choose low-fat options, such as low-fat milk instead of whole milk. ? Eat 5 or more servings of fruits or vegetables every day. ? Eat at home more often. This gives you more control over what you eat. ? Choose healthy foods when you eat out. ?   Learn to read food labels. This will help you understand how much food is considered 1 serving. ? Learn what a healthy serving size is. ? Keep low-fat snacks available. ? Limit sugary drinks, such as soda, fruit juice, sweetened iced tea, and flavored  milk.  Drink enough water to keep your urine pale yellow.  Do not follow a fad diet. Fad diets can be unhealthy and even dangerous. Physical activity  Exercise regularly, as told by your health care provider. ? Most adults should get up to 150 minutes of moderate-intensity exercise every week. ? Ask your health care provider what types of exercise are safe for you and how often you should exercise.  Warm up and stretch before being active.  Cool down and stretch after being active.  Rest between periods of activity. Lifestyle  Work with your health care provider and a dietitian to set a weight-loss goal that is healthy and reasonable for you.  Limit your screen time.  Find ways to reward yourself that do not involve food.  Do not drink alcohol if: ? Your health care provider tells you not to drink. ? You are pregnant, may be pregnant, or are planning to become pregnant.  If you drink alcohol: ? Limit how much you use to:  0-1 drink a day for women.  0-2 drinks a day for men. ? Be aware of how much alcohol is in your drink. In the [place] one drink equals one 12 oz bottle of beer (355 mL), one 5 oz glass of wine (148 mL), or one 1 oz glass of hard liquor (44 mL). General instructions  Keep a weight-loss journal to keep track of the food you eat and how much exercise you get.  Take over-the-counter and prescription medicines only as told by your health care provider.  Take vitamins and supplements only as told by your health care provider.  Consider joining a support group. Your health care provider may be able to recommend a support group.  Keep all follow-up visits as told by your health care provider. This is important. Contact a health care provider if:  You are unable to meet your weight loss goal after 6 weeks of dietary and lifestyle changes. Get help right away if you are having:  Trouble breathing.  Suicidal thoughts or behaviors. Summary  Obesity is the  condition of having too much total body fat.  Being overweight or obese means that your weight is greater than what is considered healthy for your body size.  Work with your health care provider and a dietitian to set a weight-loss goal that is healthy and reasonable for you.  Exercise regularly, as told by your health care provider. Ask your health care provider what types of exercise are safe for you and how often you should exercise. This information is not intended to replace advice given to you by your health care provider. Make sure you discuss any questions you have with your health care provider. Document Revised: 08/01/2018 Document Reviewed: 08/01/2018 Elsevier Patient Education  2020 Elsevier Inc.  

## 2020-08-11 NOTE — Progress Notes (Signed)
I,Tianna Badgett,acting as a Neurosurgeon for Gwynneth Aliment, MD.,have documented all relevant documentation on the behalf of Gwynneth Aliment, MD,as directed by  Gwynneth Aliment, MD while in the presence of Gwynneth Aliment, MD.  This visit occurred during the SARS-CoV-2 public health emergency.  Safety protocols were in place, including screening questions prior to the visit, additional usage of staff PPE, and extensive cleaning of exam room while observing appropriate contact time as indicated for disinfecting solutions.  Subjective:     Patient ID: [name] , female    DOB: 06/15/1971 , 49 y.o.   MRN: [id number]   Chief Complaint  Patient presents with   Weight Check    HPI  Patient is here today for weight check. She was started on Wegovy in July. She has noticed a decrease in appetite. She is not experiencing any side effects to this medication.     Past Medical History:  Diagnosis Date   Bacterial infection    H/O chlamydia infection    Trichomonas    Yeast infection      Family History  Problem Relation Age of Onset   Healthy Mother    ADD / ADHD Father      Current Outpatient Medications:    dexlansoprazole (DEXILANT) 60 MG capsule, Take 1 capsule (60 mg total) by mouth daily., Disp: 30 capsule, Rfl: 5   valsartan-hydrochlorothiazide (DIOVAN HCT) 160-12.5 MG tablet, Take 1 tablet by mouth daily., Disp: 30 tablet, Rfl: 11   Semaglutide-Weight Management (WEGOVY) 1 MG/0.5ML SOAJ, Inject 0.5 mLs (1 mg total) into the skin once a week., Disp: 2 mL, Rfl: 0   Vitamin D, Ergocalciferol, (DRISDOL) 1.25 MG (50000 UNIT) CAPS capsule, Take 1 capsule by mouth on Tuesday and Friday, Disp: 24 capsule, Rfl: 0   WEGOVY 0.5 MG/0.5ML SOAJ, Inject 0.5 mg into the skin once a week., Disp: 2.4 mL, Rfl: 0   No Known Allergies   Review of Systems  Constitutional: Negative.   Respiratory: Negative.   Cardiovascular: Negative.   Gastrointestinal: Negative.   Skin:        She has some skin tags she wants removed b/c they are irritating her.   Neurological: Negative.      Today's Vitals   08/11/20 1611  BP: 140/78  Pulse: 78  Temp: 98.2 F (36.8 C)  TempSrc: Oral  Weight: 288 lb 12.8 oz (131 kg)  Height: 5' 4.4" (1.636 m)   Body mass index is 48.96 kg/m.   Objective:  Physical Exam Vitals and nursing note reviewed.  Constitutional:      Appearance: Normal appearance. She is obese.  HENT:     Head: Normocephalic and atraumatic.  Cardiovascular:     Rate and Rhythm: Normal rate and regular rhythm.     Heart sounds: Normal heart sounds.  Pulmonary:     Effort: Pulmonary effort is normal.     Breath sounds: Normal breath sounds.  Skin:    General: Skin is warm.  Neurological:     General: No focal deficit present.     Mental Status: She is alert.  Psychiatric:        Mood and Affect: Mood normal.        Behavior: Behavior normal.         Assessment And Plan:     1. Class 3 severe obesity due to excess calories with serious comorbidity and body mass index (BMI) of 45.0 to 49.9 in adult [facility]) Comments: BMI 48.  We will increase dose of Wegovy to 1mg  once weekly. Encouraged to incorporate more exercise into her daily routine. She will rto in 6 wks for f/u.   2. Skin tag Comments: I will refer her to Derm for excision.  She is in agreement with treatment plan.  - Ambulatory referral to Dermatology  3. Essential hypertension, benign Comments: Chronic, fair control. She will continue with current meds for now. Encouraged to incorporate more exercise into her daily routine.   4. COVID-19 virus vaccination declined Comments: She does not wish to get COVID vaccine at this time.  She is encouraged to strive for BMI less than 30 to decrease cardiac risk. Advised to aim for at least 150 minutes of exercise per week. Wt Readings from Last 3 Encounters:  08/11/20 288 lb 12.8 oz (131 kg)  06/28/20 290 lb 9.6 oz (131.8 kg)  06/03/20 288 lb 12.8  oz (131 kg)      Patient was given opportunity to ask questions. Patient verbalized understanding of the plan and was able to repeat key elements of the plan. All questions were answered to their satisfaction.  06/05/20, MD   I, Gwynneth Aliment, MD, have reviewed all documentation for this visit. The documentation on 08/21/20 for the exam, diagnosis, procedures, and orders are all accurate and complete.  THE PATIENT IS ENCOURAGED TO PRACTICE SOCIAL DISTANCING DUE TO THE COVID-19 PANDEMIC.

## 2020-08-12 ENCOUNTER — Ambulatory Visit: Payer: Self-pay | Admitting: Adult Health

## 2020-08-21 ENCOUNTER — Encounter: Payer: Self-pay | Admitting: Internal Medicine

## 2020-09-21 ENCOUNTER — Other Ambulatory Visit: Payer: Self-pay | Admitting: Internal Medicine

## 2020-10-13 ENCOUNTER — Ambulatory Visit: Payer: BC Managed Care – PPO | Admitting: Internal Medicine

## 2020-10-25 ENCOUNTER — Other Ambulatory Visit: Payer: Self-pay | Admitting: Internal Medicine

## 2020-10-26 ENCOUNTER — Ambulatory Visit (INDEPENDENT_AMBULATORY_CARE_PROVIDER_SITE_OTHER): Payer: BC Managed Care – PPO | Admitting: Nurse Practitioner

## 2020-10-26 ENCOUNTER — Encounter: Payer: Self-pay | Admitting: Nurse Practitioner

## 2020-10-26 ENCOUNTER — Other Ambulatory Visit: Payer: Self-pay

## 2020-10-26 VITALS — BP 130/74 | HR 81 | Temp 97.7°F | Ht 64.6 in | Wt 285.2 lb

## 2020-10-26 DIAGNOSIS — K219 Gastro-esophageal reflux disease without esophagitis: Secondary | ICD-10-CM | POA: Diagnosis not present

## 2020-10-26 DIAGNOSIS — Z6841 Body Mass Index (BMI) 40.0 and over, adult: Secondary | ICD-10-CM

## 2020-10-26 DIAGNOSIS — I10 Essential (primary) hypertension: Secondary | ICD-10-CM

## 2020-10-26 DIAGNOSIS — Z1159 Encounter for screening for other viral diseases: Secondary | ICD-10-CM

## 2020-10-26 MED ORDER — WEGOVY 1.7 MG/0.75ML ~~LOC~~ SOAJ: 1.7000 mg | SUBCUTANEOUS | 1 refills | Status: DC

## 2020-10-26 NOTE — Patient Instructions (Signed)

## 2020-10-26 NOTE — Progress Notes (Signed)
I,Yamilka Roman [employer] as a Education administrator for [employer], FNP.,have documented all relevant documentation on the behalf of Minette Brine, FNP,as directed by  Minette Brine, FNP while in the presence of Minette Brine, [place]. This visit occurred during the SARS-CoV-2 public health emergency.  Safety protocols were in place, including screening questions prior to the visit, additional usage of staff PPE, and extensive cleaning of exam room while observing appropriate contact time as indicated for disinfecting solutions.  Subjective:     Patient ID: [name] , female    DOB: [birth year] , 49 y.o.   MRN: [id number]   Chief Complaint  Patient presents with   Weight Check    HPI  Patient is here today for weight check. She is on the [facility] 1 mg and tolerating well.  She is not exercising as often as she should. She is eating smaller portions with more fruits and vegetables.  She is drinking about 36 oz of water a day.    Wt Readings from Last 3 Encounters: 10/26/20 : 285 lb 3.2 oz (129.4 kg) 08/11/20 : 288 lb 12.8 oz (131 kg) 06/28/20 : 290 lb 9.6 oz (131.8 kg)    Past Medical History:  Diagnosis Date   Bacterial infection    H/O chlamydia infection    Trichomonas    Yeast infection      Family History  Problem Relation Age of Onset   Healthy Mother    ADD / ADHD Father      Current Outpatient Medications:    dexlansoprazole (DEXILANT) 60 MG capsule, Take 1 capsule (60 mg total) by mouth daily., Disp: 30 capsule, Rfl: 5   valsartan-hydrochlorothiazide (DIOVAN HCT) 160-12.5 MG tablet, Take 1 tablet by mouth daily., Disp: 30 tablet, Rfl: 11   Semaglutide-Weight Management (WEGOVY) 1.7 MG/0.75ML SOAJ, Inject 1.7 mg into the skin once a week., Disp: 3 mL, Rfl: 1   No Known Allergies   Review of Systems  Constitutional: Negative.   Respiratory: Negative.   Cardiovascular: Negative.   Skin: Negative.   Neurological: Negative.   Psychiatric/Behavioral: Negative.       Today's Vitals   10/26/20 1418  BP: 130/74  Pulse: 81  Temp: 97.7 F (36.5 C)  TempSrc: Oral  Weight: 285 lb 3.2 oz (129.4 kg)  Height: 5' 4.6" (1.641 m)  PainSc: 0-No pain   Body mass index is 48.05 kg/m.   Objective:  Physical Exam Vitals reviewed.  Constitutional:      General: She is not in acute distress.    Appearance: Normal appearance. She is obese.  Cardiovascular:     Rate and Rhythm: Normal rate and regular rhythm.     Pulses: Normal pulses.     Heart sounds: Normal heart sounds. No murmur heard.   Pulmonary:     Effort: Pulmonary effort is normal. No respiratory distress.     Breath sounds: Normal breath sounds. No wheezing.  Skin:    Capillary Refill: Capillary refill takes less than 2 seconds.  Neurological:     General: No focal deficit present.     Mental Status: She is alert and oriented to person, place, and time.     Cranial Nerves: No cranial nerve deficit.  Psychiatric:        Mood and Affect: Mood normal.        Thought Content: Thought content normal.        Judgment: Judgment normal.         Assessment And Plan:  1. Class 3 severe obesity due to excess calories with serious comorbidity and body mass index (BMI) of 45.0 to 49.9 in adult [facility])  She has lost 3 lbs since her last visit   Will increase to 1.7 mg weekly  Encouraged to increase physical activity to at least 150 minutes weekly - Semaglutide-Weight Management (WEGOVY) 1.7 MG/0.75ML SOAJ; Inject 1.7 mg into the skin once a week.  Dispense: 3 mL; Refill: 1 - BMP8+eGFR  2. Essential hypertension, benign  Chronic, good control  She had a slightly elevated creatinine at last visit will recheck today  Encouraged to stay well hydrated with water - BMP8+eGFR  3. Gastroesophageal reflux disease without esophagitis  Chronic, she is unable to get the Dexilant through her insurance due to cost I have given her samples for now  4. Encounter for hepatitis C screening test  for low risk patient  Will check Hepatitis C screening due to recent recommendations to screen all adults 18 years and older - Hepatitis C antibody     Patient was given opportunity to ask questions. Patient verbalized understanding of the plan and was able to repeat key elements of the plan. All questions were answered to their satisfaction.  Minette Brine, FNP   I, Minette Brine, FNP, have reviewed all documentation for this visit. The documentation on 10/26/20 for the exam, diagnosis, procedures, and orders are all accurate and complete.  THE PATIENT IS ENCOURAGED TO PRACTICE SOCIAL DISTANCING DUE TO THE COVID-19 PANDEMIC.

## 2020-10-27 LAB — BMP8+EGFR
BUN/Creatinine Ratio: 10 (ref 9–23)
BUN: 13 mg/dL (ref 6–24)
CO2: 24 mmol/L (ref 20–29)
Calcium: 10.5 mg/dL — ABNORMAL HIGH (ref 8.7–10.2)
Chloride: 103 mmol/L (ref 96–106)
Creatinine, Ser: 1.29 mg/dL — ABNORMAL HIGH (ref 0.57–1.00)
GFR calc Af Amer: 56 mL/min/{1.73_m2} — ABNORMAL LOW (ref >59)
GFR calc non Af Amer: 48 mL/min/{1.73_m2} — ABNORMAL LOW (ref >59)
Glucose: 85 mg/dL (ref 65–99)
Potassium: 4.3 mmol/L (ref 3.5–5.2)
Sodium: 140 mmol/L (ref 134–144)

## 2020-10-27 LAB — HEPATITIS C ANTIBODY: Hep C Virus Ab: 0.2 s/co ratio (ref 0.0–0.9)

## 2020-12-16 ENCOUNTER — Other Ambulatory Visit: Payer: Self-pay

## 2020-12-16 MED ORDER — WEGOVY 2.4 MG/0.75ML ~~LOC~~ SOAJ: 2.4000 mg | SUBCUTANEOUS | 0 refills | Status: DC

## 2020-12-27 ENCOUNTER — Telehealth: Payer: Self-pay

## 2020-12-27 NOTE — Telephone Encounter (Signed)
Patient agreed to visit.

## 2020-12-28 ENCOUNTER — Encounter: Payer: Self-pay | Admitting: Internal Medicine

## 2020-12-28 ENCOUNTER — Telehealth (INDEPENDENT_AMBULATORY_CARE_PROVIDER_SITE_OTHER): Payer: BC Managed Care – PPO | Admitting: Internal Medicine

## 2020-12-28 VITALS — Ht 64.6 in | Wt 277.0 lb

## 2020-12-28 DIAGNOSIS — Z6841 Body Mass Index (BMI) 40.0 and over, adult: Secondary | ICD-10-CM | POA: Diagnosis not present

## 2020-12-28 DIAGNOSIS — K219 Gastro-esophageal reflux disease without esophagitis: Secondary | ICD-10-CM

## 2020-12-28 MED ORDER — DEXLANSOPRAZOLE 60 MG PO CPDR: 60.0000 mg | DELAYED_RELEASE_CAPSULE | Freq: Every day | ORAL | 5 refills | Status: DC

## 2020-12-28 MED ORDER — WEGOVY 2.4 MG/0.75ML ~~LOC~~ SOAJ: 2.4000 mg | SUBCUTANEOUS | 5 refills | Status: DC

## 2020-12-28 NOTE — Patient Instructions (Signed)
Obesity, Adult Obesity is the condition of having too much total body fat. Being overweight or obese means that your weight is greater than what is considered healthy for your body size. Obesity is determined by a measurement called BMI. BMI is an estimate of body fat and is calculated from height and weight. For adults, a BMI of 30 or higher is considered obese. Obesity can lead to other health concerns and major illnesses, including:  Stroke.  Coronary artery disease (CAD).  Type 2 diabetes.  Some types of cancer, including cancers of the colon, breast, uterus, and gallbladder.  Osteoarthritis.  High blood pressure (hypertension).  High cholesterol.  Sleep apnea.  Gallbladder stones.  Infertility problems. What are the causes? Common causes of this condition include:  Eating daily meals that are high in calories, sugar, and fat.  Being born with genes that may make you more likely to become obese.  Having a medical condition that causes obesity, including: ? Hypothyroidism. ? Polycystic ovarian syndrome (PCOS). ? Binge-eating disorder. ? Cushing syndrome.  Taking certain medicines, such as steroids, antidepressants, and seizure medicines.  Not being physically active (sedentary lifestyle).  Not getting enough sleep.  Drinking high amounts of sugar-sweetened beverages, such as soft drinks. What increases the risk? The following factors may make you more likely to develop this condition:  Having a family history of obesity.  Being a woman of African American descent.  Being a man of Hispanic descent.  Living in an area with limited access to: ? Parks, recreation centers, or sidewalks. ? Healthy food choices, such as grocery stores and farmers' markets. What are the signs or symptoms? The main sign of this condition is having too much body fat. How is this diagnosed? This condition is diagnosed based on:  Your BMI. If you are an adult with a BMI of 30 or  higher, you are considered obese.  Your waist circumference. This measures the distance around your waistline.  Your skinfold thickness. Your health care provider may gently pinch a fold of your skin and measure it. You may have other tests to check for underlying conditions. How is this treated? Treatment for this condition often includes changing your lifestyle. Treatment may include some or all of the following:  Dietary changes. This may include developing a healthy meal plan.  Regular physical activity. This may include activity that causes your heart to beat faster (aerobic exercise) and strength training. Work with your health care provider to design an exercise program that works for you.  Medicine to help you lose weight if you are unable to lose 1 pound a week after 6 weeks of healthy eating and more physical activity.  Treating conditions that cause the obesity (underlying conditions).  Surgery. Surgical options may include gastric banding and gastric bypass. Surgery may be done if: ? Other treatments have not helped to improve your condition. ? You have a BMI of 40 or higher. ? You have life-threatening health problems related to obesity. Follow these instructions at home: Eating and drinking  Follow recommendations from your health care provider about what you eat and drink. Your health care provider may advise you to: ? Limit fast food, sweets, and processed snack foods. ? Choose low-fat options, such as low-fat milk instead of whole milk. ? Eat 5 or more servings of fruits or vegetables every day. ? Eat at home more often. This gives you more control over what you eat. ? Choose healthy foods when you eat out. ? Learn   to read food labels. This will help you understand how much food is considered 1 serving. ? Learn what a healthy serving size is. ? Keep low-fat snacks available. ? Limit sugary drinks, such as soda, fruit juice, sweetened iced tea, and flavored  milk.  Drink enough water to keep your urine pale yellow.  Do not follow a fad diet. Fad diets can be unhealthy and even dangerous.   Physical activity  Exercise regularly, as told by your health care provider. ? Most adults should get up to 150 minutes of moderate-intensity exercise every week. ? Ask your health care provider what types of exercise are safe for you and how often you should exercise.  Warm up and stretch before being active.  Cool down and stretch after being active.  Rest between periods of activity. Lifestyle  Work with your health care provider and a dietitian to set a weight-loss goal that is healthy and reasonable for you.  Limit your screen time.  Find ways to reward yourself that do not involve food.  Do not drink alcohol if: ? Your health care provider tells you not to drink. ? You are pregnant, may be pregnant, or are planning to become pregnant.  If you drink alcohol: ? Limit how much you use to:  0-1 drink a day for women.  0-2 drinks a day for men. ? Be aware of how much alcohol is in your drink. In the [place] one drink equals one 12 oz bottle of beer (355 mL), one 5 oz glass of wine (148 mL), or one 1 oz glass of hard liquor (44 mL). General instructions  Keep a weight-loss journal to keep track of the food you eat and how much exercise you get.  Take over-the-counter and prescription medicines only as told by your health care provider.  Take vitamins and supplements only as told by your health care provider.  Consider joining a support group. Your health care provider may be able to recommend a support group.  Keep all follow-up visits as told by your health care provider. This is important. Contact a health care provider if:  You are unable to meet your weight loss goal after 6 weeks of dietary and lifestyle changes. Get help right away if you are having:  Trouble breathing.  Suicidal thoughts or behaviors. Summary  Obesity is  the condition of having too much total body fat.  Being overweight or obese means that your weight is greater than what is considered healthy for your body size.  Work with your health care provider and a dietitian to set a weight-loss goal that is healthy and reasonable for you.  Exercise regularly, as told by your health care provider. Ask your health care provider what types of exercise are safe for you and how often you should exercise. This information is not intended to replace advice given to you by your health care provider. Make sure you discuss any questions you have with your health care provider. Document Revised: 08/01/2018 Document Reviewed: 08/01/2018 Elsevier Patient Education  2021 [employer] Inc.  

## 2020-12-28 NOTE — Progress Notes (Unsigned)
Virtual Visit via Video   This visit type was conducted due to national recommendations for restrictions regarding the COVID-19 Pandemic (e.g. social distancing) in an effort to limit this patient's exposure and mitigate transmission in our community.  Due to her co-morbid illnesses, this patient is at least at moderate risk for complications without adequate follow up.  This format is felt to be most appropriate for this patient at this time.  All issues noted in this document were discussed and addressed.  A limited physical exam was performed with this format.    This visit type was conducted due to national recommendations for restrictions regarding the COVID-19 Pandemic (e.g. social distancing) in an effort to limit this patient's exposure and mitigate transmission in our community.  Patients identity confirmed using two different identifiers.  This format is felt to be most appropriate for this patient at this time.  All issues noted in this document were discussed and addressed.  No physical exam was performed (except for noted visual exam findings with Video Visits).    Date:  01/02/2021   ID:  [name], DOB 1971-08-25, MRN [id number]  Patient Location:  Home  Provider location:   Home    Chief Complaint:  "I have weight f/u"  History of Present Illness:    [name] is a 50 y.o. female who presents via video conferencing for a telehealth visit today.    The patient does not have symptoms concerning for COVID-19 infection (fever, chills, cough, or new shortness of breath).   She presents today for virtual visit. She prefers this method of contact due to COVID-19 pandemic and inclement weather. She presents for f/u obesity. She has been taking Wegovy without any issues. She states she now weighs 277 lbs. She is pleased with her weight loss thus far.     Past Medical History:  Diagnosis Date  . Bacterial infection   . H/O chlamydia infection   . Trichomonas    . Yeast infection    Past Surgical History:  Procedure Laterality Date  . MYOMECTOMY  2009  . TUBOPLASTY / TUBOTUBAL ANASTOMOSIS  2009  . UMBILICAL HERNIA REPAIR  1973     No outpatient medications have been marked as taking for the 12/28/20 encounter (Video Visit) with Dorothyann Peng, MD.     Allergies:   Patient has no known allergies.   Social History   Tobacco Use  . Smoking status: Never Smoker  . Smokeless tobacco: Never Used  Substance Use Topics  . Alcohol use: No  . Drug use: No     Family Hx: The patient's family history includes ADD / ADHD in her father; Healthy in her mother.  ROS:   Please see the history of present illness.    Review of Systems  Constitutional: Negative.   Respiratory: Negative.   Cardiovascular: Negative.   Gastrointestinal: Negative.   Neurological: Negative.   Psychiatric/Behavioral: Negative.     All other systems reviewed and are negative.   Labs/Other Tests and Data Reviewed:    Recent Labs: 03/09/2020: ALT 18; Hemoglobin 12.9; Platelets 242; TSH 2.30 10/26/2020: BUN 13; Creatinine, Ser 1.29; Potassium 4.3; Sodium 140   Recent Lipid Panel Lab Results  Component Value Date/Time   CHOL 175 04/12/2020 12:36 PM   TRIG 119 04/12/2020 12:36 PM   HDL 55 04/12/2020 12:36 PM   CHOLHDL 3.2 04/12/2020 12:36 PM   LDLCALC 99 04/12/2020 12:36 PM    Wt Readings from Last 3 Encounters:  12/28/20 277 lb (125.6 kg)  10/26/20 285 lb 3.2 oz (129.4 kg)  08/11/20 288 lb 12.8 oz (131 kg)     Exam:    Vital Signs:  Ht 5' 4.6" (1.641 m)   Wt 277 lb (125.6 kg)   LMP 09/27/2012   BMI 46.67 kg/m     Physical Exam Vitals and nursing note reviewed.  Constitutional:      Appearance: Normal appearance. She is obese.  HENT:     Head: Normocephalic and atraumatic.  Pulmonary:     Effort: Pulmonary effort is normal.  Neurological:     Mental Status: She is alert and oriented to person, place, and time.  Psychiatric:        Mood and  Affect: Affect normal.     ASSESSMENT & PLAN:   1. Class 3 severe obesity due to excess calories with serious comorbidity and body mass index (BMI) of 45.0 to 49.9 in adult [facility]) Comments: She was congratulated on her 8 pound weight loss. I will send rx Wegovy 2.4mg  weekly to her pharmacy. She will f/u in 8 weeks for re-evaluation.   2. Gastroesophageal reflux disease without esophagitis Comments: Chronic, reminded she must stop eating 3 hours prior to going to bed.  The increase in Coats dose may also exacerbate her sx.     COVID-19 Education: The signs and symptoms of COVID-19 were discussed with the patient and how to seek care for testing (follow up with PCP or arrange E-visit).  The importance of social distancing was discussed today.  Patient Risk:   After full review of this patients clinical status, I feel that they are at least moderate risk at this time.  Time:   Today, I have spent 12 minutes with the patient with telehealth technology discussing above diagnoses.  This is a failed video visit.   Medication Adjustments/Labs and Tests Ordered: Current medicines are reviewed at length with the patient today.  Concerns regarding medicines are outlined above.   Tests Ordered: No orders of the defined types were placed in this encounter.   Medication Changes: Meds ordered this encounter  Medications  . Semaglutide-Weight Management (WEGOVY) 2.4 MG/0.75ML SOAJ    Sig: Inject 2.4 mg into the skin once a week.    Dispense:  3 mL    Refill:  5  . dexlansoprazole (DEXILANT) 60 MG capsule    Sig: Take 1 capsule (60 mg total) by mouth daily.    Dispense:  30 capsule    Refill:  5    Disposition:  Follow up in 8 week(s)  Signed, Gwynneth Aliment, MD

## 2021-01-04 ENCOUNTER — Telehealth: Payer: Self-pay

## 2021-01-04 NOTE — Telephone Encounter (Signed)
Looked at [employer]

## 2021-01-07 ENCOUNTER — Telehealth: Payer: Self-pay

## 2021-01-07 NOTE — Telephone Encounter (Signed)
LOOKING AT INSURANCE FOR PA FOR [facility]

## 2021-01-10 ENCOUNTER — Ambulatory Visit: Payer: Self-pay | Admitting: Physician Assistant

## 2021-01-17 ENCOUNTER — Other Ambulatory Visit: Payer: Self-pay | Admitting: Internal Medicine

## 2021-01-26 DIAGNOSIS — Z01419 Encounter for gynecological examination (general) (routine) without abnormal findings: Secondary | ICD-10-CM | POA: Diagnosis not present

## 2021-01-26 DIAGNOSIS — Z6841 Body Mass Index (BMI) 40.0 and over, adult: Secondary | ICD-10-CM | POA: Diagnosis not present

## 2021-01-26 DIAGNOSIS — Z1231 Encounter for screening mammogram for malignant neoplasm of breast: Secondary | ICD-10-CM | POA: Diagnosis not present

## 2021-01-26 DIAGNOSIS — Z1211 Encounter for screening for malignant neoplasm of colon: Secondary | ICD-10-CM | POA: Diagnosis not present

## 2021-01-26 LAB — HM MAMMOGRAPHY
HM Mammogram: ABNORMAL — AB (ref 0–4)
HM Mammogram: NORMAL (ref 0–4)

## 2021-02-10 ENCOUNTER — Other Ambulatory Visit: Payer: Self-pay

## 2021-02-10 ENCOUNTER — Ambulatory Visit (INDEPENDENT_AMBULATORY_CARE_PROVIDER_SITE_OTHER): Payer: BC Managed Care – PPO | Admitting: Dermatology

## 2021-02-10 ENCOUNTER — Encounter: Payer: Self-pay | Admitting: Dermatology

## 2021-02-10 DIAGNOSIS — L918 Other hypertrophic disorders of the skin: Secondary | ICD-10-CM

## 2021-02-10 NOTE — Progress Notes (Signed)
Patient here to have skin tags removed- when presented with waiver patient was unwilling to sign due to possible out of pocket cost.  Informed patient we file it with her insurance but No guarantee if they will or will not cover.   Patient NO longer needed to be seen.

## 2021-02-24 ENCOUNTER — Other Ambulatory Visit: Payer: Self-pay

## 2021-02-24 ENCOUNTER — Ambulatory Visit (INDEPENDENT_AMBULATORY_CARE_PROVIDER_SITE_OTHER): Payer: BC Managed Care – PPO | Admitting: Internal Medicine

## 2021-02-24 ENCOUNTER — Encounter: Payer: Self-pay | Admitting: Internal Medicine

## 2021-02-24 VITALS — BP 134/80 | HR 71 | Temp 98.0°F | Ht 64.6 in | Wt 273.6 lb

## 2021-02-24 DIAGNOSIS — Z6841 Body Mass Index (BMI) 40.0 and over, adult: Secondary | ICD-10-CM

## 2021-02-24 DIAGNOSIS — Z0000 Encounter for general adult medical examination without abnormal findings: Secondary | ICD-10-CM

## 2021-02-24 DIAGNOSIS — I10 Essential (primary) hypertension: Secondary | ICD-10-CM

## 2021-02-24 LAB — POCT UA - MICROALBUMIN
Albumin/Creatinine Ratio, Urine, POC: <30
Creatinine, POC: 200 mg/dL
Microalbumin Ur, POC: 30 mg/L

## 2021-02-24 LAB — POCT URINALYSIS DIPSTICK
Bilirubin, UA: NEGATIVE
Blood, UA: NEGATIVE
Glucose, UA: NEGATIVE
Ketones, UA: NEGATIVE
Leukocytes, UA: NEGATIVE
Nitrite, UA: NEGATIVE
Protein, UA: POSITIVE — AB
Spec Grav, UA: 1.015 (ref 1.010–1.025)
Urobilinogen, UA: 0.2 E.U./dL
pH, UA: 8.5 — AB (ref 5.0–8.0)

## 2021-02-24 MED ORDER — WEGOVY 2.4 MG/0.75ML ~~LOC~~ SOAJ: 2.4000 mg | SUBCUTANEOUS | 5 refills | Status: DC

## 2021-02-24 MED ORDER — DEXLANSOPRAZOLE 60 MG PO CPDR: 60.0000 mg | DELAYED_RELEASE_CAPSULE | Freq: Every day | ORAL | 2 refills | Status: DC

## 2021-02-24 MED ORDER — VALSARTAN-HYDROCHLOROTHIAZIDE 160-12.5 MG PO TABS: 1.0000 | ORAL_TABLET | Freq: Every day | ORAL | 2 refills | Status: DC

## 2021-02-24 NOTE — Patient Instructions (Signed)
Health Maintenance, Female Adopting a healthy lifestyle and getting preventive care are important in promoting health and wellness. Ask your health care provider about:  The right schedule for you to have regular tests and exams.  Things you can do on your own to prevent diseases and keep yourself healthy. What should I know about diet, weight, and exercise? Eat a healthy diet  Eat a diet that includes plenty of vegetables, fruits, low-fat dairy products, and lean protein.  Do not eat a lot of foods that are high in solid fats, added sugars, or sodium.   Maintain a healthy weight Body mass index (BMI) is used to identify weight problems. It estimates body fat based on height and weight. Your health care provider can help determine your BMI and help you achieve or maintain a healthy weight. Get regular exercise Get regular exercise. This is one of the most important things you can do for your health. Most adults should:  Exercise for at least 150 minutes each week. The exercise should increase your heart rate and make you sweat (moderate-intensity exercise).  Do strengthening exercises at least twice a week. This is in addition to the moderate-intensity exercise.  Spend less time sitting. Even light physical activity can be beneficial. Watch cholesterol and blood lipids Have your blood tested for lipids and cholesterol at 51 years of age, then have this test every 5 years. Have your cholesterol levels checked more often if:  Your lipid or cholesterol levels are high.  You are older than 51 years of age.  You are at high risk for heart disease. What should I know about cancer screening? Depending on your health history and family history, you may need to have cancer screening at various ages. This may include screening for:  Breast cancer.  Cervical cancer.  Colorectal cancer.  Skin cancer.  Lung cancer. What should I know about heart disease, diabetes, and high blood  pressure? Blood pressure and heart disease  High blood pressure causes heart disease and increases the risk of stroke. This is more likely to develop in people who have high blood pressure readings, are of African descent, or are overweight.  Have your blood pressure checked: ? Every 3-5 years if you are 18-39 years of age. ? Every year if you are 40 years old or older. Diabetes Have regular diabetes screenings. This checks your fasting blood sugar level. Have the screening done:  Once every three years after age 40 if you are at a normal weight and have a low risk for diabetes.  More often and at a younger age if you are overweight or have a high risk for diabetes. What should I know about preventing infection? Hepatitis B If you have a higher risk for hepatitis B, you should be screened for this virus. Talk with your health care provider to find out if you are at risk for hepatitis B infection. Hepatitis C Testing is recommended for:  Everyone born from 1945 through 1965.  Anyone with known risk factors for hepatitis C. Sexually transmitted infections (STIs)  Get screened for STIs, including gonorrhea and chlamydia, if: ? You are sexually active and are younger than 51 years of age. ? You are older than 51 years of age and your health care provider tells you that you are at risk for this type of infection. ? Your sexual activity has changed since you were last screened, and you are at increased risk for chlamydia or gonorrhea. Ask your health care provider   if you are at risk.  Ask your health care provider about whether you are at high risk for HIV. Your health care provider may recommend a prescription medicine to help prevent HIV infection. If you choose to take medicine to prevent HIV, you should first get tested for HIV. You should then be tested every 3 months for as long as you are taking the medicine. Pregnancy  If you are about to stop having your period (premenopausal) and  you may become pregnant, seek counseling before you get pregnant.  Take 400 to 800 micrograms (mcg) of folic acid every day if you become pregnant.  Ask for birth control (contraception) if you want to prevent pregnancy. Osteoporosis and menopause Osteoporosis is a disease in which the bones lose minerals and strength with aging. This can result in bone fractures. If you are 65 years old or older, or if you are at risk for osteoporosis and fractures, ask your health care provider if you should:  Be screened for bone loss.  Take a calcium or vitamin D supplement to lower your risk of fractures.  Be given hormone replacement therapy (HRT) to treat symptoms of menopause. Follow these instructions at home: Lifestyle  Do not use any products that contain nicotine or tobacco, such as cigarettes, e-cigarettes, and chewing tobacco. If you need help quitting, ask your health care provider.  Do not use street drugs.  Do not share needles.  Ask your health care provider for help if you need support or information about quitting drugs. Alcohol use  Do not drink alcohol if: ? Your health care provider tells you not to drink. ? You are pregnant, may be pregnant, or are planning to become pregnant.  If you drink alcohol: ? Limit how much you use to 0-1 drink a day. ? Limit intake if you are breastfeeding.  Be aware of how much alcohol is in your drink. In the [place] one drink equals one 12 oz bottle of beer (355 mL), one 5 oz glass of wine (148 mL), or one 1 oz glass of hard liquor (44 mL). General instructions  Schedule regular health, dental, and eye exams.  Stay current with your vaccines.  Tell your health care provider if: ? You often feel depressed. ? You have ever been abused or do not feel safe at home. Summary  Adopting a healthy lifestyle and getting preventive care are important in promoting health and wellness.  Follow your health care provider's instructions about healthy  diet, exercising, and getting tested or screened for diseases.  Follow your health care provider's instructions on monitoring your cholesterol and blood pressure. This information is not intended to replace advice given to you by your health care provider. Make sure you discuss any questions you have with your health care provider. Document Revised: 11/20/2018 Document Reviewed: 11/20/2018 Elsevier Patient Education  2021 Elsevier Inc.  

## 2021-02-24 NOTE — Progress Notes (Signed)
I,[name],acting as a Education administrator for [name], MD.,have documented all relevant documentation on the behalf of [name], MD,as directed by  [name], MD while in the presence of [name], MD.  This visit occurred during the SARS-CoV-2 public health emergency.  Safety protocols were in place, including screening questions prior to the visit, additional usage of staff PPE, and extensive cleaning of exam room while observing appropriate contact time as indicated for disinfecting solutions.  Subjective:     Patient ID: [name] , female    DOB: 1971-03-13 , 50 y.o.   MRN: [id number]   Chief Complaint  Patient presents with  . Annual Exam  . Hypertension    HPI  The patient is here today for a physical examination.  The patient is followed by Dr. Katharine Look Rivard for GYN care.  Her last pap and mammogram were performed February 2022. She reports compliance with meds. She denies headaches, chest pain and shortness of breath.   Hypertension This is a chronic problem. The current episode started more than 1 month ago. The problem has been gradually improving since onset. The problem is controlled. Pertinent negatives include no blurred vision, chest pain, palpitations or shortness of breath. Risk factors for coronary artery disease include obesity. Past treatments include angiotensin blockers and diuretics. The current treatment provides moderate improvement. There is no history of kidney disease.     Past Medical History:  Diagnosis Date  . Bacterial infection   . H/O chlamydia infection   . Trichomonas   . Yeast infection      Family History  Problem Relation Age of Onset  . Healthy Mother   . ADD / ADHD Father      Current Outpatient Medications:  .  dexlansoprazole (DEXILANT) 60 MG capsule, Take 1 capsule (60 mg total) by mouth daily., Disp: 90 capsule, Rfl: 2 .  Semaglutide-Weight Management (WEGOVY) 2.4 MG/0.75ML SOAJ, Inject 2.4 mg into  the skin once a week., Disp: 3 mL, Rfl: 5 .  valsartan-hydrochlorothiazide (DIOVAN HCT) 160-12.5 MG tablet, Take 1 tablet by mouth daily., Disp: 90 tablet, Rfl: 2   No Known Allergies    The patient states she uses none for birth control. Last LMP was Patient's last menstrual period was 09/27/2012.. Negative for Dysmenorrhea. Negative for: breast discharge, breast lump(s), breast pain and breast self exam. Associated symptoms include abnormal vaginal bleeding. Pertinent negatives include abnormal bleeding (hematology), anxiety, decreased libido, depression, difficulty falling sleep, dyspareunia, history of infertility, nocturia, sexual dysfunction, sleep disturbances, urinary incontinence, urinary urgency, vaginal discharge and vaginal itching. Diet regular.The patient states her exercise level is  moderate.  . The patient's tobacco use is:  Social History   Tobacco Use  Smoking Status Never Smoker  Smokeless Tobacco Never Used  . She has been exposed to passive smoke. The patient's alcohol use is:  Social History   Substance and Sexual Activity  Alcohol Use No   Review of Systems  Constitutional: Negative.   HENT: Negative.   Eyes: Negative.  Negative for blurred vision.  Respiratory: Negative.  Negative for shortness of breath.   Cardiovascular: Negative.  Negative for chest pain and palpitations.  Gastrointestinal: Negative.   Endocrine: Negative.   Genitourinary: Negative.   Musculoskeletal: Negative.   Skin: Negative.   Allergic/Immunologic: Negative.   Neurological: Negative.   Hematological: Negative.   Psychiatric/Behavioral: Negative.      Today's Vitals   02/24/21 1115  BP: 134/80  Pulse: 71  Temp: 98 F (36.7 C)  TempSrc: Oral  Weight: 273 lb 9.6 oz (124.1 kg)  Height: 5' 4.6" (1.641 m)   Body mass index is 46.1 kg/m.  Wt Readings from Last 3 Encounters:  02/24/21 273 lb 9.6 oz (124.1 kg)  12/28/20 277 lb (125.6 kg)  10/26/20 285 lb 3.2 oz (129.4 kg)    Objective:  Physical Exam Vitals and nursing note reviewed.  Constitutional:      General: She is not in acute distress.    Appearance: Normal appearance. She is well-developed. She is obese.  HENT:     Head: Normocephalic and atraumatic.     Right Ear: Hearing, tympanic membrane, ear canal and external ear normal. There is no impacted cerumen.     Left Ear: Hearing, tympanic membrane, ear canal and external ear normal. There is no impacted cerumen.     Nose:     Comments: Masked     Mouth/Throat:     Comments: Masked  Eyes:     General: Lids are normal.     Extraocular Movements: Extraocular movements intact.     Conjunctiva/sclera: Conjunctivae normal.     Pupils: Pupils are equal, round, and reactive to light.  Neck:     Thyroid: No thyroid mass.     Vascular: No carotid bruit.  Cardiovascular:     Rate and Rhythm: Normal rate and regular rhythm.     Pulses: Normal pulses.     Heart sounds: Normal heart sounds. No murmur heard.   Pulmonary:     Effort: Pulmonary effort is normal.     Breath sounds: Normal breath sounds.  Chest:  Breasts:     Tanner Score is 5.     Right: Normal.     Left: Normal.    Abdominal:     General: Bowel sounds are normal. There is no distension.     Palpations: Abdomen is soft.     Tenderness: There is no abdominal tenderness.  Genitourinary:    Comments: Deferred  Musculoskeletal:        General: No swelling. Normal range of motion.     Cervical back: Full passive range of motion without pain, normal range of motion and neck supple.     Right lower leg: No edema.     Left lower leg: No edema.  Skin:    General: Skin is warm and dry.     Capillary Refill: Capillary refill takes less than 2 seconds.  Neurological:     General: No focal deficit present.     Mental Status: She is alert and oriented to person, place, and time.     Cranial Nerves: No cranial nerve deficit.     Sensory: No sensory deficit.  Psychiatric:        Mood  and Affect: Mood normal.        Behavior: Behavior normal.        Thought Content: Thought content normal.        Judgment: Judgment normal.         Assessment And Plan:     1. Routine general medical examination at a health care facility Comments: A full exam was performed. Importance of monthly self breast exams was discussed w/ the patient. She will c/w GYN care by Dr. Cletis Media. She is up to date with CRC screening. I will request a copy of her last mammogram from Dr. Cletis Media. PATIENT IS ADVISED TO GET 30-45 MINUTES REGULAR EXERCISE NO LESS THAN FOUR TO  FIVE DAYS PER WEEK - BOTH WEIGHTBEARING EXERCISES AND AEROBIC ARE RECOMMENDED.  PATIENT IS ADVISED TO FOLLOW A HEALTHY DIET WITH AT LEAST SIX FRUITS/VEGGIES PER DAY, DECREASE INTAKE OF RED MEAT, AND TO INCREASE FISH INTAKE TO TWO DAYS PER WEEK.  MEATS/FISH SHOULD NOT BE FRIED, BAKED OR BROILED IS PREFERABLE.  IT IS ALSO IMPORTANT TO CUT BACK ON YOUR SUGAR INTAKE. PLEASE AVOID ANYTHING WITH ADDED SUGAR, CORN SYRUP OR OTHER SWEETENERS. IF YOU MUST USE A SWEETENER, YOU CAN TRY STEVIA. IT IS ALSO IMPORTANT TO AVOID ARTIFICIALLY SWEETENERS AND DIET BEVERAGES. LASTLY, I SUGGEST WEARING SPF 50 SUNSCREEN ON EXPOSED PARTS AND ESPECIALLY WHEN IN THE DIRECT SUNLIGHT FOR AN EXTENDED PERIOD OF TIME.  PLEASE AVOID FAST FOOD RESTAURANTS AND INCREASE YOUR WATER INTAKE.  - Hemoglobin A1c - CBC - CMP14+EGFR - Lipid panel  2. Essential hypertension, benign Comments: Chronic, fair control. She is aware that goal BP is less than 130/80. EKG performed, NSR w/ first degree AV block and anteroseptal infarct, age undetermined. - no change from previous study. She is currently asymptomatic. Encouraged to limit her salt intake. She will f/u in 3 months for re-evaluation.  - POCT Urinalysis Dipstick (81002) - EKG 12-Lead - POCT UA - Microalbumin - CMP14+EGFR  3. Class 3 severe obesity due to excess calories with serious comorbidity and body mass index (BMI) of 40.0 to  44.9 in adult [facility]) Comments: BMI 46. she was congratulated on her 12 pound weight loss since Nov 2021. Advised to aim for at least 150 minutes of exercise/ week. She will c/w [facility] and f/u in 2-3 months.   Patient was given opportunity to ask questions. Patient verbalized understanding of the plan and was able to repeat key elements of the plan. All questions were answered to their satisfaction.   I, [name], MD, have reviewed all documentation for this visit. The documentation on 02/26/21 for the exam, diagnosis, procedures, and orders are all accurate and complete.  THE PATIENT IS ENCOURAGED TO PRACTICE SOCIAL DISTANCING DUE TO THE COVID-19 PANDEMIC.

## 2021-02-25 LAB — CBC
Hematocrit: 39.4 % (ref 34.0–46.6)
Hemoglobin: 13.1 g/dL (ref 11.1–15.9)
MCH: 28.4 pg (ref 26.6–33.0)
MCHC: 33.2 g/dL (ref 31.5–35.7)
MCV: 86 fL (ref 79–97)
Platelets: 271 10*3/uL (ref 150–450)
RBC: 4.61 x10E6/uL (ref 3.77–5.28)
RDW: 13.1 % (ref 11.7–15.4)
WBC: 7.9 10*3/uL (ref 3.4–10.8)

## 2021-02-25 LAB — LIPID PANEL
Chol/HDL Ratio: 3.4 ratio (ref 0.0–4.4)
Cholesterol, Total: 172 mg/dL (ref 100–199)
HDL: 50 mg/dL (ref >39)
LDL Chol Calc (NIH): 107 mg/dL — ABNORMAL HIGH (ref 0–99)
Triglycerides: 83 mg/dL (ref 0–149)
VLDL Cholesterol Cal: 15 mg/dL (ref 5–40)

## 2021-02-25 LAB — CMP14+EGFR
ALT: 17 IU/L (ref 0–32)
AST: 16 IU/L (ref 0–40)
Albumin/Globulin Ratio: 1.5 (ref 1.2–2.2)
Albumin: 4.3 g/dL (ref 3.8–4.8)
Alkaline Phosphatase: 55 IU/L (ref 44–121)
BUN/Creatinine Ratio: 9 (ref 9–23)
BUN: 9 mg/dL (ref 6–24)
Bilirubin Total: 0.2 mg/dL (ref 0.0–1.2)
CO2: 22 mmol/L (ref 20–29)
Calcium: 10.2 mg/dL (ref 8.7–10.2)
Chloride: 106 mmol/L (ref 96–106)
Creatinine, Ser: 1.02 mg/dL — ABNORMAL HIGH (ref 0.57–1.00)
Globulin, Total: 2.9 g/dL (ref 1.5–4.5)
Glucose: 75 mg/dL (ref 65–99)
Potassium: 4.4 mmol/L (ref 3.5–5.2)
Sodium: 143 mmol/L (ref 134–144)
Total Protein: 7.2 g/dL (ref 6.0–8.5)
eGFR: 67 mL/min/{1.73_m2} (ref >59)

## 2021-02-25 LAB — HEMOGLOBIN A1C
Est. average glucose Bld gHb Est-mCnc: 114 mg/dL
Hgb A1c MFr Bld: 5.6 % (ref 4.8–5.6)

## 2021-05-30 ENCOUNTER — Ambulatory Visit: Payer: BC Managed Care – PPO | Admitting: Internal Medicine

## 2021-06-02 ENCOUNTER — Telehealth: Payer: Self-pay

## 2021-06-02 NOTE — Telephone Encounter (Signed)
The pt said that she will wait on the investigation with her insurance and if it's not resolved soon she will schdule a an appt and pay cash for a blood pressure f/u.

## 2021-06-27 ENCOUNTER — Ambulatory Visit (INDEPENDENT_AMBULATORY_CARE_PROVIDER_SITE_OTHER): Payer: BC Managed Care – PPO | Admitting: Internal Medicine

## 2021-06-27 ENCOUNTER — Other Ambulatory Visit: Payer: Self-pay

## 2021-06-27 ENCOUNTER — Encounter: Payer: Self-pay | Admitting: Internal Medicine

## 2021-06-27 VITALS — BP 114/76 | HR 72 | Temp 97.9°F | Ht 64.6 in | Wt 262.0 lb

## 2021-06-27 DIAGNOSIS — Z6841 Body Mass Index (BMI) 40.0 and over, adult: Secondary | ICD-10-CM | POA: Diagnosis not present

## 2021-06-27 DIAGNOSIS — I10 Essential (primary) hypertension: Secondary | ICD-10-CM

## 2021-06-27 DIAGNOSIS — Z23 Encounter for immunization: Secondary | ICD-10-CM | POA: Diagnosis not present

## 2021-06-27 DIAGNOSIS — E66813 Obesity, class 3: Secondary | ICD-10-CM

## 2021-06-27 MED ORDER — TETANUS-DIPHTH-ACELL PERTUSSIS 5-2.5-18.5 LF-MCG/0.5 IM SUSY
0.5000 mL | PREFILLED_SYRINGE | Freq: Once | INTRAMUSCULAR | Status: AC
  Administered 2021-06-27: 0.5 mL via INTRAMUSCULAR

## 2021-06-27 NOTE — Progress Notes (Signed)
I,[name],acting as a Neurosurgeon for Gwynneth Aliment, MD.,have documented all relevant documentation on the behalf of Gwynneth Aliment, MD,as directed by  Gwynneth Aliment, MD while in the presence of Gwynneth Aliment, MD.  This visit occurred during the SARS-CoV-2 public health emergency.  Safety protocols were in place, including screening questions prior to the visit, additional usage of staff PPE, and extensive cleaning of exam room while observing appropriate contact time as indicated for disinfecting solutions.  Subjective:     Patient ID: [name] , female    DOB: March 05, 1971 , 50 y.o.   MRN: [id number]   Chief Complaint  Patient presents with   Hypertension   Obesity    HPI  The patient is here today for a follow-up on her blood pressure and weight. She reports compliance with meds. Admits to exercising 30 minutes 3 days per week. She is not having any issues with valsartan/hct and Wegovy.   Hypertension This is a chronic problem. The current episode started more than 1 month ago. The problem has been gradually improving since onset. The problem is controlled. Pertinent negatives include no blurred vision, chest pain, orthopnea, shortness of breath or sweats. Risk factors for coronary artery disease include obesity. Past treatments include angiotensin blockers and diuretics. The current treatment provides moderate improvement.    Past Medical History:  Diagnosis Date   Bacterial infection    H/O chlamydia infection    Trichomonas    Yeast infection      Family History  Problem Relation Age of Onset   Healthy Mother    ADD / ADHD Father      Current Outpatient Medications:    omeprazole (PRILOSEC) 20 MG capsule, Take 20 mg by mouth daily., Disp: , Rfl:    Semaglutide-Weight Management (WEGOVY) 2.4 MG/0.75ML SOAJ, Inject 2.4 mg into the skin once a week., Disp: 3 mL, Rfl: 5   valsartan-hydrochlorothiazide (DIOVAN HCT) 160-12.5 MG tablet, Take 1 tablet by mouth  daily., Disp: 90 tablet, Rfl: 2   No Known Allergies   Review of Systems  Constitutional: Negative.   Eyes:  Negative for blurred vision.  Respiratory: Negative.  Negative for shortness of breath.   Cardiovascular: Negative.  Negative for chest pain and orthopnea.  Gastrointestinal: Negative.   Psychiatric/Behavioral: Negative.    All other systems reviewed and are negative.   Today's Vitals   06/27/21 0837  BP: 114/76  Pulse: 72  Temp: 97.9 F (36.6 C)  TempSrc: Oral  Weight: 262 lb (118.8 kg)  Height: 5' 4.6" (1.641 m)   Body mass index is 44.14 kg/m.  Wt Readings from Last 3 Encounters:  06/27/21 262 lb (118.8 kg)  02/24/21 273 lb 9.6 oz (124.1 kg)  12/28/20 277 lb (125.6 kg)    BP Readings from Last 3 Encounters:  06/27/21 114/76  02/24/21 134/80  10/26/20 130/74    Objective:  Physical Exam Vitals and nursing note reviewed.  Constitutional:      Appearance: Normal appearance. She is obese.  HENT:     Head: Normocephalic and atraumatic.     Nose:     Comments: Masked     Mouth/Throat:     Comments: Masked  Cardiovascular:     Rate and Rhythm: Normal rate and regular rhythm.     Heart sounds: Normal heart sounds.  Pulmonary:     Effort: Pulmonary effort is normal.     Breath sounds: Normal breath sounds.  Musculoskeletal:  Cervical back: Normal range of motion.  Skin:    General: Skin is warm.  Neurological:     General: No focal deficit present.     Mental Status: She is alert.  Psychiatric:        Mood and Affect: Mood normal.        Behavior: Behavior normal.        Assessment And Plan:     1. Essential hypertension, benign Comments: Chronic, well controlled. Advised to follow low sodium diet. I will check renal function at her next visit.   2. Class 3 severe obesity due to excess calories with serious comorbidity and body mass index (BMI) of 40.0 to 44.9 in adult [facility]) Comments: BMI 44. She was congratulated on her 11 pound weight loss  since March 2022.  She will c/w Wegovy and rto in 8 weeks for f/u.   3. Immunization due Comments: She was given Tdap to update her immunization history. I will send rx Shingrix to her local pharmacy.  - Tdap (BOOSTRIX) injection 0.5 mL   Patient was given opportunity to ask questions. Patient verbalized understanding of the plan and was able to repeat key elements of the plan. All questions were answered to their satisfaction.   I, Gwynneth Aliment, MD, have reviewed all documentation for this visit. The documentation on 06/27/21 for the exam, diagnosis, procedures, and orders are all accurate and complete.   IF YOU HAVE BEEN REFERRED TO A SPECIALIST, IT MAY TAKE 1-2 WEEKS TO SCHEDULE/PROCESS THE REFERRAL. IF YOU HAVE NOT HEARD FROM US/SPECIALIST IN TWO WEEKS, PLEASE GIVE [place] A CALL AT [phone].   THE PATIENT IS ENCOURAGED TO PRACTICE SOCIAL DISTANCING DUE TO THE COVID-19 PANDEMIC.

## 2021-06-27 NOTE — Patient Instructions (Signed)
Exercising to Lose Weight Exercise is structured, repetitive physical activity to improve fitness and health. Getting regular exercise is important for everyone. It is especially important if you are overweight. Being overweight increases your risk of heart disease, stroke, diabetes, high blood pressure, and several types of cancer.Reducing your calorie intake and exercising can help you lose weight. Exercise is usually categorized as moderate or vigorous intensity. To lose weight, most people need to do a certain amount of moderate-intensity orvigorous-intensity exercise each week. Moderate-intensity exercise  Moderate-intensity exercise is any activity that gets you moving enough to burn at least three times more energy (calories) than if you were sitting. Examples of moderate exercise include: Walking a mile in 15 minutes. Doing light yard work. Biking at an easy pace. Most people should get at least 150 minutes (2 hours and 30 minutes) a week ofmoderate-intensity exercise to maintain their body weight. Vigorous-intensity exercise Vigorous-intensity exercise is any activity that gets you moving enough to burn at least six times more calories than if you were sitting. When you exercise at this intensity, you should be working hard enough that you are not able tocarry on a conversation. Examples of vigorous exercise include: Running. Playing a team sport, such as football, basketball, and soccer. Jumping rope. Most people should get at least 75 minutes (1 hour and 15 minutes) a week ofvigorous-intensity exercise to maintain their body weight. How can exercise affect me? When you exercise enough to burn more calories than you eat, you lose weight. Exercise also reduces body fat and builds muscle. The more muscle you have, the more calories you burn. Exercise also: Improves mood. Reduces stress and tension. Improves your overall fitness, flexibility, and endurance. Increases bone strength. The  amount of exercise you need to lose weight depends on: Your age. The type of exercise. Any health conditions you have. Your overall physical ability. Talk to your health care provider about how much exercise you need and whattypes of activities are safe for you. What actions can I take to lose weight? Nutrition  Make changes to your diet as told by your health care provider or diet and nutrition specialist (dietitian). This may include: Eating fewer calories. Eating more protein. Eating less unhealthy fats. Eating a diet that includes fresh fruits and vegetables, whole grains, low-fat dairy products, and lean protein. Avoiding foods with added fat, salt, and sugar. Drink plenty of water while you exercise to prevent dehydration or heat stroke.  Activity Choose an activity that you enjoy and set realistic goals. Your health care provider can help you make an exercise plan that works for you. Exercise at a moderate or vigorous intensity most days of the week. The intensity of exercise may vary from person to person. You can tell how intense a workout is for you by paying attention to your breathing and heartbeat. Most people will notice their breathing and heartbeat get faster with more intense exercise. Do resistance training twice each week, such as: Push-ups. Sit-ups. Lifting weights. Using resistance bands. Getting short amounts of exercise can be just as helpful as long structured periods of exercise. If you have trouble finding time to exercise, try to include exercise in your daily routine. Get up, stretch, and walk around every 30 minutes throughout the day. Go for a walk during your lunch break. Park your car farther away from your destination. If you take public transportation, get off one stop early and walk the rest of the way. Make phone calls while standing up and   walking around. Take the stairs instead of elevators or escalators. Wear comfortable clothes and shoes with  good support. Do not exercise so much that you hurt yourself, feel dizzy, or get very short of breath. Where to find more information [place] Department of Health and Human Services: www.hhs.gov Centers for Disease Control and Prevention (CDC): www.cdc.gov Contact a health care provider: Before starting a new exercise program. If you have questions or concerns about your weight. If you have a medical problem that keeps you from exercising. Get help right away if you have any of the following while exercising: Injury. Dizziness. Difficulty breathing or shortness of breath that does not go away when you stop exercising. Chest pain. Rapid heartbeat. Summary Being overweight increases your risk of heart disease, stroke, diabetes, high blood pressure, and several types of cancer. Losing weight happens when you burn more calories than you eat. Reducing the amount of calories you eat in addition to getting regular moderate or vigorous exercise each week helps you lose weight. This information is not intended to replace advice given to you by your health care provider. Make sure you discuss any questions you have with your healthcare provider. Document Revised: 03/08/2020 Document Reviewed: 03/25/2020 Elsevier Patient Education  2022 Elsevier Inc.  

## 2021-06-28 ENCOUNTER — Ambulatory Visit: Payer: Self-pay | Admitting: Internal Medicine

## 2021-07-17 NOTE — Progress Notes (Signed)
   New Patient   Subjective  [name] is a 50 y.o. female who presents for the following: Skin Problem (Skin tags under breast, neck and groin).  Skin tags, patient chose not to be seen to avoid possible out-of-pocket cost Location:  Duration:  Quality:  Associated Signs/Symptoms: Modifying Factors:  Severity:  Timing: Context:    The following portions of the chart were reviewed this encounter and updated as appropriate:      Objective  Well appearing patient in no apparent distress; mood and affect are within normal limits. Left Inframammary Fold History of skin tags but patient left before she was seen.    Patient not examined.   Assessment & Plan  Skin tag Left Inframammary Fold  Reschedule visit if she so desires.  Epidermal / dermal shaving - Left Inframammary Fold  Destruction of lesion - Left Inframammary Fold

## 2021-08-25 ENCOUNTER — Other Ambulatory Visit: Payer: Self-pay

## 2021-08-25 ENCOUNTER — Ambulatory Visit (INDEPENDENT_AMBULATORY_CARE_PROVIDER_SITE_OTHER): Payer: BC Managed Care – PPO | Admitting: Internal Medicine

## 2021-08-25 ENCOUNTER — Encounter: Payer: Self-pay | Admitting: Internal Medicine

## 2021-08-25 VITALS — BP 112/80 | HR 72 | Temp 97.7°F | Ht 64.2 in | Wt 263.0 lb

## 2021-08-25 DIAGNOSIS — Z2821 Immunization not carried out because of patient refusal: Secondary | ICD-10-CM

## 2021-08-25 DIAGNOSIS — Z23 Encounter for immunization: Secondary | ICD-10-CM | POA: Diagnosis not present

## 2021-08-25 DIAGNOSIS — I10 Essential (primary) hypertension: Secondary | ICD-10-CM | POA: Diagnosis not present

## 2021-08-25 DIAGNOSIS — Z6841 Body Mass Index (BMI) 40.0 and over, adult: Secondary | ICD-10-CM

## 2021-08-25 DIAGNOSIS — E662 Morbid (severe) obesity with alveolar hypoventilation: Secondary | ICD-10-CM

## 2021-08-25 DIAGNOSIS — K219 Gastro-esophageal reflux disease without esophagitis: Secondary | ICD-10-CM

## 2021-08-25 MED ORDER — PANTOPRAZOLE SODIUM 40 MG PO TBEC: 40.0000 mg | DELAYED_RELEASE_TABLET | Freq: Every day | ORAL | 1 refills | Status: DC

## 2021-08-25 MED ORDER — ZOSTER VAC RECOMB ADJUVANTED 50 MCG/0.5ML IM SUSR: 0.5000 mL | Freq: Once | INTRAMUSCULAR | 0 refills | Status: AC

## 2021-08-25 NOTE — Progress Notes (Signed)
I,YAMILKA J Llittleton,acting as a Education administrator for [name], MD.,have documented all relevant documentation on the behalf of [name], MD,as directed by  [name], MD while in the presence of [name], MD.  This visit occurred during the SARS-CoV-2 public health emergency.  Safety protocols were in place, including screening questions prior to the visit, additional usage of staff PPE, and extensive cleaning of exam room while observing appropriate contact time as indicated for disinfecting solutions.  Subjective:     Patient ID: [name] , female    DOB: [birth year] , 50 y.o.   MRN: [id number]   Chief Complaint  Patient presents with   Hypertension   Weight Check    HPI  The patient is here today for a follow-up on her blood pressure and weight.  She reports compliance with meds. Denies headaches, chest pain and shortness of breath.   Hypertension This is a chronic problem. The current episode started more than 1 month ago. The problem has been gradually improving since onset. The problem is controlled. Pertinent negatives include no blurred vision, chest pain, orthopnea, shortness of breath or sweats. Risk factors for coronary artery disease include obesity. Past treatments include angiotensin blockers and diuretics. The current treatment provides moderate improvement.    Past Medical History:  Diagnosis Date   Bacterial infection    H/O chlamydia infection    Trichomonas    Yeast infection      Family History  Problem Relation Age of Onset   Healthy Mother    ADD / ADHD Father      Current Outpatient Medications:    pantoprazole (PROTONIX) 40 MG tablet, Take 1 tablet (40 mg total) by mouth daily., Disp: 30 tablet, Rfl: 1   Semaglutide-Weight Management (WEGOVY) 2.4 MG/0.75ML SOAJ, Inject 2.4 mg into the skin once a week., Disp: 3 mL, Rfl: 5   valsartan-hydrochlorothiazide (DIOVAN HCT) 160-12.5 MG tablet, Take 1 tablet by mouth daily., Disp: 90  tablet, Rfl: 2   No Known Allergies   Review of Systems  Constitutional: Negative.   Eyes: Negative.  Negative for blurred vision.  Respiratory: Negative.  Negative for shortness of breath.   Cardiovascular: Negative.  Negative for chest pain and orthopnea.  Gastrointestinal: Negative.        Unfortunately, her insurance will no longer cover [place].  Prilosec and Nexium  have not been effective for her.   Skin: Negative.   Neurological: Negative.   Psychiatric/Behavioral: Negative.      Today's Vitals   08/25/21 1039  BP: 112/80  Pulse: 72  Temp: 97.7 F (36.5 C)  Weight: 263 lb (119.3 kg)  Height: 5' 4.2" (1.631 m)  PainSc: 0-No pain   Body mass index is 44.86 kg/m.  Wt Readings from Last 3 Encounters:  08/25/21 263 lb (119.3 kg)  06/27/21 262 lb (118.8 kg)  02/24/21 273 lb 9.6 oz (124.1 kg)    Objective:  Physical Exam Vitals and nursing note reviewed.  Constitutional:      Appearance: Normal appearance.  HENT:     Head: Normocephalic and atraumatic.     Nose:     Comments: Masked     Mouth/Throat:     Comments: Masked  Eyes:     Extraocular Movements: Extraocular movements intact.  Cardiovascular:     Rate and Rhythm: Normal rate and regular rhythm.     Heart sounds: Normal heart sounds.  Pulmonary:     Effort: Pulmonary effort is normal.  Breath sounds: Normal breath sounds.  Musculoskeletal:     Cervical back: Normal range of motion.  Skin:    General: Skin is warm.  Neurological:     General: No focal deficit present.     Mental Status: She is alert.  Psychiatric:        Mood and Affect: Mood normal.        Behavior: Behavior normal.        Assessment And Plan:     1. Essential hypertension, benign Comments: Chronic, well controlled. NO med changes today. Encouraged to follow low sodium diet.  - CMP14+EGFR - Insulin, random(561)  2. Gastroesophageal reflux disease without esophagitis Comments: Dexilant no longer covered. I will send  rx pantoprazole to her local pharmacy. She will let me know if she has any issues w/ this medication.   3. Class 3 severe obesity due to excess calories with serious comorbidity and body mass index (BMI) of 40.0 to 44.9 in adult [facility]) Comments: She is encouraged to aim for at least 150 minutes of exercise per week. She will c/w [facility] 2.38m weekly.   4. Immunization due Comments: I will send rx Shingrix to her local pharmacy.  - Zoster Vaccine Adjuvanted ([facility] injection; Inject 0.5 mLs into the muscle once for 1 dose.  Dispense: 0.5 mL; Refill: 0  5. Influenza vaccination declined     Patient was given opportunity to ask questions. Patient verbalized understanding of the plan and was able to repeat key elements of the plan. All questions were answered to their satisfaction.   I, RMaximino Greenland MD, have reviewed all documentation for this visit. The documentation on 08/25/21 for the exam, diagnosis, procedures, and orders are all accurate and complete.   IF YOU HAVE BEEN REFERRED TO A SPECIALIST, IT MAY TAKE 1-2 WEEKS TO SCHEDULE/PROCESS THE REFERRAL. IF YOU HAVE NOT HEARD FROM US/SPECIALIST IN TWO WEEKS, PLEASE GIVE [place] CALL AT [phone].   THE PATIENT IS ENCOURAGED TO PRACTICE SOCIAL DISTANCING DUE TO THE COVID-19 PANDEMIC.

## 2021-08-25 NOTE — Patient Instructions (Signed)

## 2021-08-26 LAB — CMP14+EGFR
ALT: 12 IU/L (ref 0–32)
AST: 13 IU/L (ref 0–40)
Albumin/Globulin Ratio: 1.6 (ref 1.2–2.2)
Albumin: 4.2 g/dL (ref 3.8–4.8)
Alkaline Phosphatase: 52 IU/L (ref 44–121)
BUN/Creatinine Ratio: 11 (ref 9–23)
BUN: 12 mg/dL (ref 6–24)
Bilirubin Total: 0.4 mg/dL (ref 0.0–1.2)
CO2: 23 mmol/L (ref 20–29)
Calcium: 10.1 mg/dL (ref 8.7–10.2)
Chloride: 105 mmol/L (ref 96–106)
Creatinine, Ser: 1.1 mg/dL — ABNORMAL HIGH (ref 0.57–1.00)
Globulin, Total: 2.7 g/dL (ref 1.5–4.5)
Glucose: 75 mg/dL (ref 65–99)
Potassium: 4.3 mmol/L (ref 3.5–5.2)
Sodium: 144 mmol/L (ref 134–144)
Total Protein: 6.9 g/dL (ref 6.0–8.5)
eGFR: 61 mL/min/{1.73_m2} (ref >59)

## 2021-08-26 LAB — INSULIN, RANDOM: INSULIN: 11.5 u[IU]/mL (ref 2.6–24.9)

## 2021-11-14 ENCOUNTER — Encounter: Payer: Self-pay | Admitting: Internal Medicine

## 2021-11-14 ENCOUNTER — Ambulatory Visit (INDEPENDENT_AMBULATORY_CARE_PROVIDER_SITE_OTHER): Payer: BC Managed Care – PPO | Admitting: Internal Medicine

## 2021-11-14 ENCOUNTER — Other Ambulatory Visit: Payer: Self-pay

## 2021-11-14 VITALS — BP 112/68 | HR 83 | Temp 98.0°F | Ht 64.2 in | Wt 259.2 lb

## 2021-11-14 DIAGNOSIS — Z6841 Body Mass Index (BMI) 40.0 and over, adult: Secondary | ICD-10-CM | POA: Diagnosis not present

## 2021-11-14 DIAGNOSIS — Z23 Encounter for immunization: Secondary | ICD-10-CM

## 2021-11-14 DIAGNOSIS — I10 Essential (primary) hypertension: Secondary | ICD-10-CM | POA: Diagnosis not present

## 2021-11-14 DIAGNOSIS — L918 Other hypertrophic disorders of the skin: Secondary | ICD-10-CM

## 2021-11-14 DIAGNOSIS — Z2821 Immunization not carried out because of patient refusal: Secondary | ICD-10-CM

## 2021-11-14 MED ORDER — VALSARTAN-HYDROCHLOROTHIAZIDE 160-12.5 MG PO TABS: 1.0000 | ORAL_TABLET | Freq: Every day | ORAL | 2 refills | Status: DC

## 2021-11-14 NOTE — Progress Notes (Signed)
I,[name],acting as a Education administrator for Renee Greenland, Nunez.,have documented all relevant documentation on the behalf of Renee Greenland, Nunez,as directed by  Renee Greenland, Nunez while in the presence of Renee Greenland, Nunez.  This visit occurred during the SARS-CoV-2 public health emergency.  Safety protocols were in place, including screening questions prior to the visit, additional usage of staff PPE, and extensive cleaning of exam room while observing appropriate contact time as indicated for disinfecting solutions.  Subjective:     Patient ID: [name] , female    DOB: [birth year] , 50 y.o.   MRN: [id number]   Chief Complaint  Patient presents with   Hypertension   Obesity    HPI  The patient is here today for a follow-up on her blood pressure and weight.  She is currently taking weekly Wegovy 2.63m without any issues. She is now exercising four days per week. Denies c/o headaches, chest pain and shortness of breath.   Hypertension This is a chronic problem. The current episode started more than 1 month ago. The problem has been gradually improving since onset. The problem is controlled. Pertinent negatives include no blurred vision, chest pain, orthopnea, shortness of breath or sweats. Risk factors for coronary artery disease include obesity. Past treatments include angiotensin blockers and diuretics. The current treatment provides moderate improvement.    Past Medical History:  Diagnosis Date   Bacterial infection    H/O chlamydia infection    Trichomonas    Yeast infection      Family History  Problem Relation Age of Onset   Healthy Mother    ADD / ADHD Father      Current Outpatient Medications:    Semaglutide-Weight Management (WEGOVY) 2.4 MG/0.75ML SOAJ, Inject 2.4 mg into the skin once a week., Disp: 3 mL, Rfl: 5   valsartan-hydrochlorothiazide (DIOVAN HCT) 160-12.5 MG tablet, Take 1 tablet by mouth daily., Disp: 90 tablet, Rfl: 2   No Known Allergies    Review of Systems  Constitutional: Negative.   Eyes:  Negative for blurred vision.  Respiratory: Negative.  Negative for shortness of breath.   Cardiovascular: Negative.  Negative for chest pain and orthopnea.  Gastrointestinal: Negative.   Psychiatric/Behavioral: Negative.    All other systems reviewed and are negative.   Today's Vitals   11/14/21 1457  BP: 112/68  Pulse: 83  Temp: 98 F (36.7 C)  Weight: 259 lb 3.2 oz (117.6 kg)  Height: 5' 4.2" (1.631 m)   Body mass index is 44.21 kg/m.  Wt Readings from Last 3 Encounters:  11/14/21 259 lb 3.2 oz (117.6 kg)  08/25/21 263 lb (119.3 kg)  06/27/21 262 lb (118.8 kg)    BP Readings from Last 3 Encounters:  11/14/21 112/68  08/25/21 112/80  06/27/21 114/76    Objective:  Physical Exam Vitals and nursing note reviewed.  Constitutional:      Appearance: Normal appearance. She is obese.  HENT:     Head: Normocephalic and atraumatic.     Nose:     Comments: Masked     Mouth/Throat:     Comments: Masked  Eyes:     Extraocular Movements: Extraocular movements intact.  Cardiovascular:     Rate and Rhythm: Normal rate and regular rhythm.     Heart sounds: Normal heart sounds.  Pulmonary:     Effort: Pulmonary effort is normal.     Breath sounds: Normal breath sounds.  Musculoskeletal:     Cervical back: Normal  range of motion.  Skin:    General: Skin is warm.     Comments: Papular, hyperpigmented lesions on face, neck  Neurological:     General: No focal deficit present.     Mental Status: She is alert.  Psychiatric:        Mood and Affect: Mood normal.        Behavior: Behavior normal.        Assessment And Plan:     1. Essential hypertension, benign Comments: Chronic, well controlled.  NO med changes. I will send refill of valsartan/hct 160/12.30m daily.  She will f/u in 4-6 months. - BMP8+EGFR  2. Skin tag Comments: She has multiple lesions on face, torso. They have become bothersome, one on her face  affects her vision. Some lesions are suggestive of DPN's.  - Ambulatory referral to Dermatology  3. Class 3 severe obesity due to excess calories with serious comorbidity and body mass index (BMI) of 40.0 to 44.9 in adult ([facility] Comments: She was congratulated on her 4 lb weight loss in 8 wks. [facility] and f/u in 8-10 weeks.   4. Immunization due Comments: She was given Shingrix to update her immunization history.  She will get second vaccine in 2-6 months.   5. COVID-19 vaccination declined   Patient was given opportunity to ask questions. Patient verbalized understanding of the plan and was able to repeat key elements of the plan. All questions were answered to their satisfaction.   I, RoMaximino GreenlandMD, have reviewed all documentation for this visit. The documentation on 11/14/21 for the exam, diagnosis, procedures, and orders are all accurate and complete.   IF YOU HAVE BEEN REFERRED TO A SPECIALIST, IT MAY TAKE 1-2 WEEKS TO SCHEDULE/PROCESS THE REFERRAL. IF YOU HAVE NOT HEARD FROM US/SPECIALIST IN TWO WEEKS, PLEASE GIVE [place] CALL AT [phone].   THE PATIENT IS ENCOURAGED TO PRACTICE SOCIAL DISTANCING DUE TO THE COVID-19 PANDEMIC.

## 2021-11-14 NOTE — Patient Instructions (Addendum)
Zoster Vaccine, Recombinant injection °What is this medication? °ZOSTER VACCINE (ZOS ter vak SEEN) is a vaccine used to reduce the risk of getting shingles. This vaccine is not used to treat shingles or nerve pain from shingles. °This medicine may be used for other purposes; ask your health care provider or pharmacist if you have questions. °COMMON BRAND NAME(S): SHINGRIX °What should I tell my care team before I take this medication? °They need to know if you have any of these conditions: °cancer °immune system problems °an unusual or allergic reaction to Zoster vaccine, other medications, foods, dyes, or preservatives °pregnant or trying to get pregnant °breast-feeding °How should I use this medication? °This vaccine is injected into a muscle. It is given by a health care provider. °A copy of Vaccine Information Statements will be given before each vaccination. Be sure to read this information carefully each time. This sheet may change often. °Talk to your health care provider about the use of this vaccine in children. This vaccine is not approved for use in children. °Overdosage: If you think you have taken too much of this medicine contact a poison control center or emergency room at once. °NOTE: This medicine is only for you. Do not share this medicine with others. °What if I miss a dose? °Keep appointments for follow-up (booster) doses. It is important not to miss your dose. Call your health care provider if you are unable to keep an appointment. °What may interact with this medication? °medicines that suppress your immune system °medicines to treat cancer °steroid medicines like prednisone or cortisone °This list may not describe all possible interactions. Give your health care provider a list of all the medicines, herbs, non-prescription drugs, or dietary supplements you use. Also tell them if you smoke, drink alcohol, or use illegal drugs. Some items may interact with your medicine. °What should I watch for  while using this medication? °Visit your health care provider regularly. °This vaccine, like all vaccines, may not fully protect everyone. °What side effects may I notice from receiving this medication? °Side effects that you should report to your doctor or health care professional as soon as possible: °allergic reactions (skin rash, itching or hives; swelling of the face, lips, or tongue) °trouble breathing °Side effects that usually do not require medical attention (report these to your doctor or health care professional if they continue or are bothersome): °chills °headache °fever °nausea °pain, redness, or irritation at site where injected °tiredness °vomiting °This list may not describe all possible side effects. Call your doctor for medical advice about side effects. You may report side effects to FDA at 1-800-FDA-1088. °Where should I keep my medication? °This vaccine is only given by a health care provider. It will not be stored at home. °NOTE: This sheet is a summary. It may not cover all possible information. If you have questions about this medicine, talk to your doctor, pharmacist, or health care provider. °© 2022 Elsevier/Gold Standard (2021-08-16 00:00:00) ° °

## 2021-11-15 LAB — BMP8+EGFR
BUN/Creatinine Ratio: 14 (ref 9–23)
BUN: 15 mg/dL (ref 6–24)
CO2: 24 mmol/L (ref 20–29)
Calcium: 10.3 mg/dL — ABNORMAL HIGH (ref 8.7–10.2)
Chloride: 105 mmol/L (ref 96–106)
Creatinine, Ser: 1.07 mg/dL — ABNORMAL HIGH (ref 0.57–1.00)
Glucose: 84 mg/dL (ref 70–99)
Potassium: 3.6 mmol/L (ref 3.5–5.2)
Sodium: 143 mmol/L (ref 134–144)
eGFR: 63 mL/min/{1.73_m2} (ref >59)

## 2021-12-14 ENCOUNTER — Other Ambulatory Visit: Payer: Self-pay

## 2021-12-14 NOTE — Telephone Encounter (Signed)
Prior auth done for wegovy 2.4mg , waiting for response from the pt's insurance.

## 2021-12-21 DIAGNOSIS — L918 Other hypertrophic disorders of the skin: Secondary | ICD-10-CM | POA: Diagnosis not present

## 2021-12-21 DIAGNOSIS — L821 Other seborrheic keratosis: Secondary | ICD-10-CM | POA: Diagnosis not present

## 2022-02-07 DIAGNOSIS — Z1231 Encounter for screening mammogram for malignant neoplasm of breast: Secondary | ICD-10-CM | POA: Diagnosis not present

## 2022-02-07 DIAGNOSIS — Z01419 Encounter for gynecological examination (general) (routine) without abnormal findings: Secondary | ICD-10-CM | POA: Diagnosis not present

## 2022-02-07 DIAGNOSIS — Z6841 Body Mass Index (BMI) 40.0 and over, adult: Secondary | ICD-10-CM | POA: Diagnosis not present

## 2022-02-07 DIAGNOSIS — Z1211 Encounter for screening for malignant neoplasm of colon: Secondary | ICD-10-CM | POA: Diagnosis not present

## 2022-02-08 DIAGNOSIS — Z1231 Encounter for screening mammogram for malignant neoplasm of breast: Secondary | ICD-10-CM | POA: Diagnosis not present

## 2022-02-28 ENCOUNTER — Other Ambulatory Visit: Payer: Self-pay

## 2022-02-28 ENCOUNTER — Ambulatory Visit (INDEPENDENT_AMBULATORY_CARE_PROVIDER_SITE_OTHER): Payer: BC Managed Care – PPO | Admitting: Internal Medicine

## 2022-02-28 ENCOUNTER — Encounter: Payer: Self-pay | Admitting: Internal Medicine

## 2022-02-28 VITALS — BP 112/72 | HR 70 | Temp 98.1°F | Ht 64.0 in | Wt 255.2 lb

## 2022-02-28 DIAGNOSIS — Z23 Encounter for immunization: Secondary | ICD-10-CM

## 2022-02-28 DIAGNOSIS — Z0000 Encounter for general adult medical examination without abnormal findings: Secondary | ICD-10-CM

## 2022-02-28 DIAGNOSIS — Z6841 Body Mass Index (BMI) 40.0 and over, adult: Secondary | ICD-10-CM

## 2022-02-28 DIAGNOSIS — I10 Essential (primary) hypertension: Secondary | ICD-10-CM

## 2022-02-28 LAB — POCT URINALYSIS DIPSTICK
Bilirubin, UA: NEGATIVE
Blood, UA: NEGATIVE
Glucose, UA: NEGATIVE
Nitrite, UA: NEGATIVE
Protein, UA: POSITIVE — AB
Spec Grav, UA: 1.02 (ref 1.010–1.025)
Urobilinogen, UA: 1 E.U./dL
pH, UA: 7.5 (ref 5.0–8.0)

## 2022-02-28 MED ORDER — WEGOVY 2.4 MG/0.75ML ~~LOC~~ SOAJ: 2.4000 mg | SUBCUTANEOUS | 2 refills | Status: DC

## 2022-02-28 MED ORDER — PANTOPRAZOLE SODIUM 40 MG PO TBEC: 40.0000 mg | DELAYED_RELEASE_TABLET | Freq: Every day | ORAL | 1 refills | Status: DC

## 2022-02-28 NOTE — Patient Instructions (Signed)

## 2022-02-28 NOTE — Progress Notes (Signed)
?I,Victoria T Hamilton,acting as a scribe for [name], MD.,have documented all relevant documentation on the behalf of [name], MD,as directed by  [name], MD while in the presence of [name], MD.  ?This visit occurred during the SARS-CoV-2 public health emergency.  Safety protocols were in place, including screening questions prior to the visit, additional usage of staff PPE, and extensive cleaning of exam room while observing appropriate contact time as indicated for disinfecting solutions. ? ?Subjective:  ?  ? Patient ID: [name] , female    DOB: 16-Jan-1971 , 51 y.o.   MRN: [id number] ? ? ?Chief Complaint  ?Patient presents with  ? Annual Exam  ? Hypertension  ? ? ?HPI ? ?The patient is here today for a physical examination.  The patient is followed by Dr. Katharine Look Rivard for her GYN care. She reports compliance with meds. She denies headaches, chest pain and shortness of breath.  ? ?Hypertension ?This is a chronic problem. The current episode started more than 1 month ago. The problem has been gradually improving since onset. The problem is controlled. Pertinent negatives include no blurred vision, chest pain, palpitations or shortness of breath. Risk factors for coronary artery disease include obesity. Past treatments include angiotensin blockers and diuretics. The current treatment provides moderate improvement. There is no history of kidney disease.  ?  ? ?Past Medical History:  ?Diagnosis Date  ? Bacterial infection   ? H/O chlamydia infection   ? Trichomonas   ? Yeast infection   ?  ? ?Family History  ?Problem Relation Age of Onset  ? Healthy Mother   ? ADD / ADHD Father   ? ? ? ?Current Outpatient Medications:  ?  pantoprazole (PROTONIX) 40 MG tablet, Take 1 tablet (40 mg total) by mouth daily., Disp: 30 tablet, Rfl: 1 ?  valsartan-hydrochlorothiazide (DIOVAN HCT) 160-12.5 MG tablet, Take 1 tablet by mouth daily., Disp: 90 tablet, Rfl: 2 ?  Semaglutide-Weight  Management (WEGOVY) 2.4 MG/0.75ML SOAJ, Inject 2.4 mg into the skin once a week., Disp: 9 mL, Rfl: 2  ? ?No Known Allergies  ? ? ?The patient states she uses tubal ligation for birth control. Last LMP was Patient's last menstrual period was 09/27/2012.. Negative for Dysmenorrhea. Negative for: breast discharge, breast lump(s), breast pain and breast self exam. Associated symptoms include abnormal vaginal bleeding. Pertinent negatives include abnormal bleeding (hematology), anxiety, decreased libido, depression, difficulty falling sleep, dyspareunia, history of infertility, nocturia, sexual dysfunction, sleep disturbances, urinary incontinence, urinary urgency, vaginal discharge and vaginal itching. Diet regular.The patient states her exercise level is   ? Marland Kitchen The patient's tobacco use is:  ?Social History  ? ?Tobacco Use  ?Smoking Status Never  ?Smokeless Tobacco Never  ?Marland Kitchen She has been exposed to passive smoke. The patient's alcohol use is:  ?Social History  ? ?Substance and Sexual Activity  ?Alcohol Use No  ? ? ?Review of Systems  ?Constitutional: Negative.   ?HENT: Negative.    ?Eyes: Negative.  Negative for blurred vision.  ?Respiratory: Negative.  Negative for shortness of breath.   ?Cardiovascular: Negative.  Negative for chest pain and palpitations.  ?Gastrointestinal: Negative.   ?Endocrine: Negative.   ?Genitourinary: Negative.   ?Musculoskeletal: Negative.   ?Skin: Negative.   ?Allergic/Immunologic: Negative.   ?Neurological: Negative.   ?Hematological: Negative.   ?Psychiatric/Behavioral: Negative.     ? ?Today's Vitals  ? 02/28/22 1013  ?BP: 112/72  ?Pulse: 70  ?Temp: 98.1 ?F (36.7 ?C)  ?SpO2:  98%  ?Weight: 255 lb 3.2 oz (115.8 kg)  ?Height: 5' 4"  (1.626 m)  ? ?Body mass index is 43.8 kg/m?.  ?Wt Readings from Last 3 Encounters:  ?02/28/22 255 lb 3.2 oz (115.8 kg)  ?11/14/21 259 lb 3.2 oz (117.6 kg)  ?08/25/21 263 lb (119.3 kg)  ?  ?Objective:  ?Physical Exam ?Vitals and nursing note reviewed.   ?Constitutional:   ?   General: She is not in acute distress. ?   Appearance: Normal appearance. She is well-developed. She is obese.  ?HENT:  ?   Head: Normocephalic and atraumatic.  ?   Right Ear: Hearing, tympanic membrane, ear canal and external ear normal. There is no impacted cerumen.  ?   Left Ear: Hearing, tympanic membrane, ear canal and external ear normal. There is no impacted cerumen.  ?   Nose:  ?   Comments: Deferred - masked ?   Mouth/Throat:  ?   Comments: Deferred - masked ?Eyes:  ?   General: Lids are normal.  ?   Extraocular Movements: Extraocular movements intact.  ?   Conjunctiva/sclera: Conjunctivae normal.  ?   Pupils: Pupils are equal, round, and reactive to light.  ?   Funduscopic exam: ?   Right eye: No papilledema.     ?   Left eye: No papilledema.  ?Neck:  ?   Thyroid: No thyroid mass.  ?   Vascular: No carotid bruit.  ?Cardiovascular:  ?   Rate and Rhythm: Normal rate and regular rhythm.  ?   Pulses: Normal pulses.  ?   Heart sounds: Normal heart sounds. No murmur heard. ?Pulmonary:  ?   Effort: Pulmonary effort is normal.  ?   Breath sounds: Normal breath sounds.  ?Chest:  ?   Chest wall: No mass.  ?Breasts: ?   Tanner Score is 5.  ?   Right: Normal. No mass or tenderness.  ?   Left: Normal. No mass or tenderness.  ?Abdominal:  ?   General: Bowel sounds are normal. There is no distension.  ?   Palpations: Abdomen is soft.  ?   Tenderness: There is no abdominal tenderness.  ?   Comments: Obese, soft. Difficult to assess organomegaly  ?Genitourinary: ?   Rectum: Guaiac result negative.  ?   Comments: deferred ?Musculoskeletal:     ?   General: No swelling. Normal range of motion.  ?   Cervical back: Full passive range of motion without pain, normal range of motion and neck supple.  ?   Right lower leg: No edema.  ?   Left lower leg: No edema.  ?Lymphadenopathy:  ?   Upper Body:  ?   Right upper body: No supraclavicular, axillary or pectoral adenopathy.  ?   Left upper body: No  supraclavicular, axillary or pectoral adenopathy.  ?Skin: ?   General: Skin is warm and dry.  ?   Capillary Refill: Capillary refill takes less than 2 seconds.  ?   Comments: Nevus on nose, right forehead  ?Neurological:  ?   General: No focal deficit present.  ?   Mental Status: She is alert and oriented to person, place, and time.  ?   Cranial Nerves: No cranial nerve deficit.  ?   Sensory: No sensory deficit.  ?Psychiatric:     ?   Mood and Affect: Mood normal.     ?   Behavior: Behavior normal.     ?   Thought Content: Thought content normal.     ?  Judgment: Judgment normal.  ?   ?Assessment And Plan:  ?   ?1. Encounter for annual physical exam ?Comments: A full exam was performed. Importance of monthly self breast exams was discussed with the patient. PATIENT IS ADVISED TO GET 30-45 MINUTES REGULAR EXERCISE NO LESS THAN FOUR TO FIVE DAYS PER WEEK - BOTH WEIGHTBEARING EXERCISES AND AEROBIC ARE RECOMMENDED.  PATIENT IS ADVISED TO FOLLOW A HEALTHY DIET WITH AT LEAST SIX FRUITS/VEGGIES PER DAY, DECREASE INTAKE OF RED MEAT, AND TO INCREASE FISH INTAKE TO TWO DAYS PER WEEK.  MEATS/FISH SHOULD NOT BE FRIED, BAKED OR BROILED IS PREFERABLE.  IT IS ALSO IMPORTANT TO CUT BACK ON YOUR SUGAR INTAKE. PLEASE AVOID ANYTHING WITH ADDED SUGAR, CORN SYRUP OR OTHER SWEETENERS. IF YOU MUST USE A SWEETENER, YOU CAN TRY STEVIA. IT IS ALSO IMPORTANT TO AVOID ARTIFICIALLY SWEETENERS AND DIET BEVERAGES. LASTLY, I SUGGEST WEARING SPF 50 SUNSCREEN ON EXPOSED PARTS AND ESPECIALLY WHEN IN THE DIRECT SUNLIGHT FOR AN EXTENDED PERIOD OF TIME.  PLEASE AVOID FAST FOOD RESTAURANTS AND INCREASE YOUR WATER INTAKE. ?- POCT Urinalysis Dipstick (81002) ?- Microalbumin / creatinine urine ratio ?- CMP14+EGFR ?- CBC ?- Lipid panel ?- Insulin, random(561) ?- Vitamin D (25 hydroxy) ? ?2. Essential hypertension, benign ?Comments: Chronic, well controlled. EKG performed, NSR w/ old anteroseptal infarct, no new changes. She is encouraged to follow low  sodium diet. No med changes. She will rto in six months for re-evaluation.  ?- POCT Urinalysis Dipstick (81002) ?- Microalbumin / creatinine urine ratio ?- EKG 12-Lead ? ?3. Class 3 severe obesity due to excess calories with serious com

## 2022-03-01 LAB — CMP14+EGFR
ALT: 14 IU/L (ref 0–32)
AST: 14 IU/L (ref 0–40)
Albumin/Globulin Ratio: 1.5 (ref 1.2–2.2)
Albumin: 4.4 g/dL (ref 3.8–4.9)
Alkaline Phosphatase: 56 IU/L (ref 44–121)
BUN/Creatinine Ratio: 9 (ref 9–23)
BUN: 10 mg/dL (ref 6–24)
Bilirubin Total: 0.3 mg/dL (ref 0.0–1.2)
CO2: 21 mmol/L (ref 20–29)
Calcium: 10.3 mg/dL — ABNORMAL HIGH (ref 8.7–10.2)
Chloride: 108 mmol/L — ABNORMAL HIGH (ref 96–106)
Creatinine, Ser: 1.12 mg/dL — ABNORMAL HIGH (ref 0.57–1.00)
Globulin, Total: 2.9 g/dL (ref 1.5–4.5)
Glucose: 74 mg/dL (ref 70–99)
Potassium: 4.3 mmol/L (ref 3.5–5.2)
Sodium: 143 mmol/L (ref 134–144)
Total Protein: 7.3 g/dL (ref 6.0–8.5)
eGFR: 60 mL/min/{1.73_m2} (ref >59)

## 2022-03-01 LAB — CBC
Hematocrit: 39.8 % (ref 34.0–46.6)
Hemoglobin: 12.8 g/dL (ref 11.1–15.9)
MCH: 27.9 pg (ref 26.6–33.0)
MCHC: 32.2 g/dL (ref 31.5–35.7)
MCV: 87 fL (ref 79–97)
Platelets: 237 10*3/uL (ref 150–450)
RBC: 4.59 x10E6/uL (ref 3.77–5.28)
RDW: 12.7 % (ref 11.7–15.4)
WBC: 7 10*3/uL (ref 3.4–10.8)

## 2022-03-01 LAB — LIPID PANEL
Chol/HDL Ratio: 3.2 ratio (ref 0.0–4.4)
Cholesterol, Total: 191 mg/dL (ref 100–199)
HDL: 60 mg/dL (ref >39)
LDL Chol Calc (NIH): 113 mg/dL — ABNORMAL HIGH (ref 0–99)
Triglycerides: 99 mg/dL (ref 0–149)
VLDL Cholesterol Cal: 18 mg/dL (ref 5–40)

## 2022-03-01 LAB — VITAMIN D 25 HYDROXY (VIT D DEFICIENCY, FRACTURES): Vit D, 25-Hydroxy: 14 ng/mL — ABNORMAL LOW (ref 30.0–100.0)

## 2022-03-01 LAB — INSULIN, RANDOM: INSULIN: 14.2 u[IU]/mL (ref 2.6–24.9)

## 2022-03-01 LAB — MICROALBUMIN / CREATININE URINE RATIO
Creatinine, Urine: 269.1 mg/dL
Microalb/Creat Ratio: 1 mg/g creat (ref 0–29)
Microalbumin, Urine: 4 ug/mL

## 2022-05-29 ENCOUNTER — Encounter: Payer: Self-pay | Admitting: Internal Medicine

## 2022-05-29 ENCOUNTER — Telehealth (INDEPENDENT_AMBULATORY_CARE_PROVIDER_SITE_OTHER): Payer: BC Managed Care – PPO | Admitting: Internal Medicine

## 2022-05-29 VITALS — Ht 64.0 in | Wt 257.0 lb

## 2022-05-29 DIAGNOSIS — Z6841 Body Mass Index (BMI) 40.0 and over, adult: Secondary | ICD-10-CM

## 2022-05-29 DIAGNOSIS — I10 Essential (primary) hypertension: Secondary | ICD-10-CM

## 2022-05-29 DIAGNOSIS — B351 Tinea unguium: Secondary | ICD-10-CM

## 2022-05-29 MED ORDER — VALSARTAN-HYDROCHLOROTHIAZIDE 160-12.5 MG PO TABS
1.0000 | ORAL_TABLET | Freq: Every day | ORAL | 2 refills | Status: DC
Start: 2022-05-29 — End: 2024-05-13

## 2022-05-29 MED ORDER — WEGOVY 2.4 MG/0.75ML ~~LOC~~ SOAJ: 2.4000 mg | SUBCUTANEOUS | 2 refills | Status: DC

## 2022-05-29 MED ORDER — CICLOPIROX 8 % EX SOLN: Freq: Every day | CUTANEOUS | 1 refills | Status: DC

## 2022-05-29 NOTE — Progress Notes (Signed)
Virtual Visit via Video   This visit type was conducted due to national recommendations for restrictions regarding the COVID-19 Pandemic (e.g. social distancing) in an effort to limit this patient's exposure and mitigate transmission in our community.  Due to her co-morbid illnesses, this patient is at least at moderate risk for complications without adequate follow up.  This format is felt to be most appropriate for this patient at this time.  All issues noted in this document were discussed and addressed.  A limited physical exam was performed with this format.    This visit type was conducted due to national recommendations for restrictions regarding the COVID-19 Pandemic (e.g. social distancing) in an effort to limit this patient's exposure and mitigate transmission in our community.  Patients identity confirmed using two different identifiers.  This format is felt to be most appropriate for this patient at this time.  All issues noted in this document were discussed and addressed.  No physical exam was performed (except for noted visual exam findings with Video Visits).    Date:  05/29/2022   ID:  [name], DOB November 14, 1971, MRN [id number]  Patient Location:  Home  Provider location:   Office  Chief Complaint:  "I have BP/weight check"  History of Present Illness:    [name] is a 51 y.o. female who presents via video conferencing for a telehealth visit today.    The patient does not have symptoms concerning for COVID-19 infection (fever, chills, cough, or new shortness of breath).   She presents today for a virtual visit, this is her preferred method of contact. Patient presents today for a BP and weight check. Unfortunately, she has not had the medication in five weeks. She was advised by her pharmacy that there is an issue with her insurance.   Hypertension This is a chronic problem. The current episode started more than 1 month ago. The problem has been gradually  improving since onset. The problem is controlled. Pertinent negatives include no blurred vision, chest pain, orthopnea, shortness of breath or sweats. Risk factors for coronary artery disease include obesity. Past treatments include angiotensin blockers and diuretics. The current treatment provides moderate improvement.     Past Medical History:  Diagnosis Date   Bacterial infection    H/O chlamydia infection    Trichomonas    Yeast infection    Past Surgical History:  Procedure Laterality Date   MYOMECTOMY  2009   TUBOPLASTY / TUBOTUBAL ANASTOMOSIS  2009   UMBILICAL HERNIA REPAIR  1973     Current Meds  Medication Sig   ciclopirox (PENLAC) 8 % solution Apply topically at bedtime. Apply over nail and surrounding skin. Apply daily over previous coat. After seven (7) days, may remove with alcohol and continue cycle.     Allergies:   Patient has no known allergies.   Social History   Tobacco Use   Smoking status: Never   Smokeless tobacco: Never  Substance Use Topics   Alcohol use: No   Drug use: No     Family Hx: The patient's family history includes ADD / ADHD in her father; Healthy in her mother.  ROS:   Please see the history of present illness.    Review of Systems  Constitutional: Negative.   Eyes:  Negative for blurred vision.  Respiratory: Negative.  Negative for shortness of breath.   Cardiovascular:  Negative for chest pain, orthopnea and leg swelling.  Gastrointestinal: Negative.   Skin:  She c/o thickened toenail, thinks it is a fungus. Has tried many OTC treatments without relief of her sx.   Neurological: Negative.   Psychiatric/Behavioral: Negative.    All other systems reviewed and are negative.   All other systems reviewed and are negative.   Labs/Other Tests and Data Reviewed:    Recent Labs: 02/28/2022: ALT 14; BUN 10; Creatinine, Ser 1.12; Hemoglobin 12.8; Platelets 237; Potassium 4.3; Sodium 143   Recent Lipid Panel Lab Results   Component Value Date/Time   CHOL 191 02/28/2022 11:08 AM   TRIG 99 02/28/2022 11:08 AM   HDL 60 02/28/2022 11:08 AM   CHOLHDL 3.2 02/28/2022 11:08 AM   LDLCALC 113 (H) 02/28/2022 11:08 AM    Wt Readings from Last 3 Encounters:  05/29/22 257 lb (116.6 kg)  02/28/22 255 lb 3.2 oz (115.8 kg)  11/14/21 259 lb 3.2 oz (117.6 kg)     Exam:    Vital Signs:  Ht 5\' 4"  (1.626 m)   Wt 257 lb (116.6 kg)   LMP 09/27/2012   BMI 44.11 kg/m     Physical Exam Vitals and nursing note reviewed.  Constitutional:      Appearance: Normal appearance.  HENT:     Head: Normocephalic and atraumatic.  Eyes:     Extraocular Movements: Extraocular movements intact.  Pulmonary:     Effort: Pulmonary effort is normal.  Musculoskeletal:     Cervical back: Normal range of motion.  Feet:     Left foot:     Toenail Condition: Left toenails are abnormally thick. Fungal disease present. Neurological:     Mental Status: She is alert and oriented to person, place, and time.  Psychiatric:        Mood and Affect: Mood and affect normal.        Behavior: Behavior normal.    ASSESSMENT & PLAN:    1. Essential hypertension, benign Comments: Chronic, she will c/w valsartan/hctz.   2. Onychomycosis Comments: Seen on R great toe, thickened nail. She has tried multiple OTC remedies, w/o relief. I will send rx [place].   3. Class 3 severe obesity due to excess calories with serious comorbidity and body mass index (BMI) of 40.0 to 44.9 in adult [facility]) Comments: BMI 43. She was congratulated on her 4 lb weight loss since Dec 2022. She will c/w [facility] 2.4mg  weekly, encouraged to aim for at least 150 min of exercise/wk.  - Semaglutide-Weight Management (WEGOVY) 2.4 MG/0.75ML SOAJ; Inject 2.4 mg into the skin once a week.  Dispense: 9 mL; Refill: 2  COVID-19 Education: The signs and symptoms of COVID-19 were discussed with the patient and how to seek care for testing (follow up with PCP or arrange E-visit).  The  importance of social distancing was discussed today.  Patient Risk:   After full review of this patients clinical status, I feel that they are at least moderate risk at this time.  Time:   Today, I have spent 10 minutes/ seconds with the patient with telehealth technology discussing above diagnoses.     Medication Adjustments/Labs and Tests Ordered: Current medicines are reviewed at length with the patient today.  Concerns regarding medicines are outlined above.   Tests Ordered: No orders of the defined types were placed in this encounter.   Medication Changes: Meds ordered this encounter  Medications   valsartan-hydrochlorothiazide (DIOVAN HCT) 160-12.5 MG tablet    Sig: Take 1 tablet by mouth daily.    Dispense:  90 tablet  Refill:  2   Semaglutide-Weight Management (WEGOVY) 2.4 MG/0.75ML SOAJ    Sig: Inject 2.4 mg into the skin once a week.    Dispense:  9 mL    Refill:  2   ciclopirox (PENLAC) 8 % solution    Sig: Apply topically at bedtime. Apply over nail and surrounding skin. Apply daily over previous coat. After seven (7) days, may remove with alcohol and continue cycle.    Dispense:  6.6 mL    Refill:  1    Disposition:  Follow up prn  Signed, Gwynneth Aliment, MD

## 2022-05-29 NOTE — Patient Instructions (Signed)

## 2022-08-31 ENCOUNTER — Ambulatory Visit: Payer: BC Managed Care – PPO | Admitting: Internal Medicine

## 2022-08-31 ENCOUNTER — Encounter: Payer: Self-pay | Admitting: Internal Medicine

## 2022-08-31 VITALS — BP 132/84 | HR 71 | Temp 98.3°F | Ht 64.4 in | Wt 259.8 lb

## 2022-08-31 DIAGNOSIS — Z2821 Immunization not carried out because of patient refusal: Secondary | ICD-10-CM | POA: Diagnosis not present

## 2022-08-31 DIAGNOSIS — Z6841 Body Mass Index (BMI) 40.0 and over, adult: Secondary | ICD-10-CM | POA: Diagnosis not present

## 2022-08-31 DIAGNOSIS — I10 Essential (primary) hypertension: Secondary | ICD-10-CM | POA: Diagnosis not present

## 2022-08-31 DIAGNOSIS — E66813 Obesity, class 3: Secondary | ICD-10-CM

## 2022-08-31 MED ORDER — WEGOVY 2.4 MG/0.75ML ~~LOC~~ SOAJ: 2.4000 mg | SUBCUTANEOUS | 2 refills | Status: DC

## 2022-08-31 NOTE — Patient Instructions (Signed)
Hypertension, Adult ?Hypertension is another name for high blood pressure. High blood pressure forces your heart to work harder to pump blood. This can cause problems over time. ?There are two numbers in a blood pressure reading. There is a top number (systolic) over a bottom number (diastolic). It is best to have a blood pressure that is below 120/80. ?What are the causes? ?The cause of this condition is not known. Some other conditions can lead to high blood pressure. ?What increases the risk? ?Some lifestyle factors can make you more likely to develop high blood pressure: ?Smoking. ?Not getting enough exercise or physical activity. ?Being overweight. ?Having too much fat, sugar, calories, or salt (sodium) in your diet. ?Drinking too much alcohol. ?Other risk factors include: ?Having any of these conditions: ?Heart disease. ?Diabetes. ?High cholesterol. ?Kidney disease. ?Obstructive sleep apnea. ?Having a family history of high blood pressure and high cholesterol. ?Age. The risk increases with age. ?Stress. ?What are the signs or symptoms? ?High blood pressure may not cause symptoms. Very high blood pressure (hypertensive crisis) may cause: ?Headache. ?Fast or uneven heartbeats (palpitations). ?Shortness of breath. ?Nosebleed. ?Vomiting or feeling like you may vomit (nauseous). ?Changes in how you see. ?Very bad chest pain. ?Feeling dizzy. ?Seizures. ?How is this treated? ?This condition is treated by making healthy lifestyle changes, such as: ?Eating healthy foods. ?Exercising more. ?Drinking less alcohol. ?Your doctor may prescribe medicine if lifestyle changes do not help enough and if: ?Your top number is above 130. ?Your bottom number is above 80. ?Your personal target blood pressure may vary. ?Follow these instructions at home: ?Eating and drinking ? ?If told, follow the DASH eating plan. To follow this plan: ?Fill one half of your plate at each meal with fruits and vegetables. ?Fill one fourth of your plate  at each meal with whole grains. Whole grains include whole-wheat pasta, brown rice, and whole-grain bread. ?Eat or drink low-fat dairy products, such as skim milk or low-fat yogurt. ?Fill one fourth of your plate at each meal with low-fat (lean) proteins. Low-fat proteins include fish, chicken without skin, eggs, beans, and tofu. ?Avoid fatty meat, cured and processed meat, or chicken with skin. ?Avoid pre-made or processed food. ?Limit the amount of salt in your diet to less than 1,500 mg each day. ?Do not drink alcohol if: ?Your doctor tells you not to drink. ?You are pregnant, may be pregnant, or are planning to become pregnant. ?If you drink alcohol: ?Limit how much you have to: ?0-1 drink a day for women. ?0-2 drinks a day for men. ?Know how much alcohol is in your drink. In the [place] one drink equals one 12 oz bottle of beer (355 mL), one 5 oz glass of wine (148 mL), or one 1? oz glass of hard liquor (44 mL). ?Lifestyle ? ?Work with your doctor to stay at a healthy weight or to lose weight. Ask your doctor what the best weight is for you. ?Get at least 30 minutes of exercise that causes your heart to beat faster (aerobic exercise) most days of the week. This may include walking, swimming, or biking. ?Get at least 30 minutes of exercise that strengthens your muscles (resistance exercise) at least 3 days a week. This may include lifting weights or doing Pilates. ?Do not smoke or use any products that contain nicotine or tobacco. If you need help quitting, ask your doctor. ?Check your blood pressure at home as told by your doctor. ?Keep all follow-up visits. ?Medicines ?Take over-the-counter and prescription medicines   only as told by your doctor. Follow directions carefully. ?Do not skip doses of blood pressure medicine. The medicine does not work as well if you skip doses. Skipping doses also puts you at risk for problems. ?Ask your doctor about side effects or reactions to medicines that you should watch  for. ?Contact a doctor if: ?You think you are having a reaction to the medicine you are taking. ?You have headaches that keep coming back. ?You feel dizzy. ?You have swelling in your ankles. ?You have trouble with your vision. ?Get help right away if: ?You get a very bad headache. ?You start to feel mixed up (confused). ?You feel weak or numb. ?You feel faint. ?You have very bad pain in your: ?Chest. ?Belly (abdomen). ?You vomit more than once. ?You have trouble breathing. ?These symptoms may be an emergency. Get help right away. Call 911. ?Do not wait to see if the symptoms will go away. ?Do not drive yourself to the hospital. ?Summary ?Hypertension is another name for high blood pressure. ?High blood pressure forces your heart to work harder to pump blood. ?For most people, a normal blood pressure is less than 120/80. ?Making healthy choices can help lower blood pressure. If your blood pressure does not get lower with healthy choices, you may need to take medicine. ?This information is not intended to replace advice given to you by your health care provider. Make sure you discuss any questions you have with your health care provider. ?Document Revised: 09/15/2021 Document Reviewed: 09/15/2021 ?Elsevier Patient Education ? 2023 [employer] ? ?

## 2022-08-31 NOTE — Progress Notes (Signed)
Rich Brave [place]leton,acting as a Education administrator for [name], MD.,have documented all relevant documentation on the behalf of [name], MD,as directed by  [name], MD while in the presence of [name], MD.    Subjective:     Patient ID: [name] , female    DOB: 29-Nov-1971 , 51 y.o.   MRN: [id number]   Chief Complaint  Patient presents with   Hypertension    HPI  Patient presents today for a bp check. She reports compliance with meds.  She denies having any headaches, chest pain and shortness of breath.  She has not had any issues with valsartan/hctz. She is now exercising 3 days per week, walking.   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 She is hopeful to find something by the end of the year.   Hypertension This is a chronic problem. The current episode started more than 1 month ago. The problem has been gradually improving since onset. The problem is controlled. Pertinent negatives include no blurred vision, orthopnea or sweats. Risk factors for coronary artery disease include obesity. Past treatments include angiotensin blockers and diuretics. The current treatment provides moderate improvement.     Past Medical History:  Diagnosis Date   Bacterial infection    H/O chlamydia infection    Trichomonas    Yeast infection      Family History  Problem Relation Age of Onset   Healthy Mother    ADD / ADHD Father      Current Outpatient Medications:    ciclopirox (PENLAC) 8 % solution, Apply topically at bedtime. Apply over nail and surrounding skin. Apply daily over previous coat. After seven (7) days, may remove with alcohol and continue cycle., Disp: 6.6 mL, Rfl: 1   omeprazole (PRILOSEC) 10 MG capsule, Take 10 mg by mouth daily., Disp: , Rfl:    valsartan-hydrochlorothiazide (DIOVAN HCT) 160-12.5 MG tablet, Take 1 tablet by mouth daily., Disp: 90 tablet, Rfl: 2   Semaglutide-Weight Management  (WEGOVY) 2.4 MG/0.75ML SOAJ, Inject 2.4 mg into the skin once a week., Disp: 9 mL, Rfl: 2   No Known Allergies   Review of Systems  Constitutional: Negative.   Eyes: Negative.  Negative for blurred vision.  Respiratory: Negative.    Cardiovascular: Negative.  Negative for orthopnea.  Musculoskeletal: Negative.   Skin: Negative.   Neurological: Negative.   Psychiatric/Behavioral: Negative.       Today's Vitals   08/31/22 0856 08/31/22 0925  BP: 136/68 132/84  Pulse: 71   Temp: 98.3 F (36.8 C)   Weight: 259 lb 12.8 oz (117.8 kg)   Height: 5' 4.4" (1.636 m)   PainSc: 0-No pain    Body mass index is 44.04 kg/m.  Wt Readings from Last 3 Encounters:  08/31/22 259 lb 12.8 oz (117.8 kg)  05/29/22 257 lb (116.6 kg)  02/28/22 255 lb 3.2 oz (115.8 kg)     Objective:  Physical Exam Vitals and nursing note reviewed.  Constitutional:      Appearance: Normal appearance.  HENT:     Head: Normocephalic and atraumatic.  Eyes:     Extraocular Movements: Extraocular movements intact.  Cardiovascular:     Rate and Rhythm: Normal rate and regular rhythm.     Heart sounds: Normal heart sounds.  Pulmonary:     Effort: Pulmonary effort is normal.     Breath sounds: Normal breath sounds.  Musculoskeletal:     Cervical back:  Normal range of motion.  Skin:    General: Skin is warm.  Neurological:     General: No focal deficit present.     Mental Status: She is alert.  Psychiatric:        Mood and Affect: Mood normal.        Behavior: Behavior normal.         Assessment And Plan:     1. Essential hypertension, benign Comments: Chronic, fair control. She will c/w valsartan/hctz 160/12.63m daily. I will check renal function today. She will f/u in  - CMP14+EGFR  2. Class 3 severe obesity due to excess calories with serious comorbidity and body mass index (BMI) of 40.0 to 44.9 in adult ([facility] Comments: She is encouraged to increase her exercise to no less than 5 days per week, 30  minutes. [facility]. F/u in 10 weeks.  - Semaglutide-Weight Management (WEGOVY) 2.4 MG/0.75ML SOAJ; Inject 2.4 mg into the skin once a week.  Dispense: 9 mL; Refill: 2  3. COVID-19 vaccination declined  4. Influenza vaccination declined   Patient was given opportunity to ask questions. Patient verbalized understanding of the plan and was able to repeat key elements of the plan. All questions were answered to their satisfaction.   I, RoMaximino GreenlandMD, have reviewed all documentation for this visit. The documentation on 08/31/22 for the exam, diagnosis, procedures, and orders are all accurate and complete.   IF YOU HAVE BEEN REFERRED TO A SPECIALIST, IT MAY TAKE 1-2 WEEKS TO SCHEDULE/PROCESS THE REFERRAL. IF YOU HAVE NOT HEARD FROM US/SPECIALIST IN TWO WEEKS, PLEASE GIVE [place] CALL AT [phone].   THE PATIENT IS ENCOURAGED TO PRACTICE SOCIAL DISTANCING DUE TO THE COVID-19 PANDEMIC.

## 2022-09-01 LAB — CMP14+EGFR
ALT: 11 IU/L (ref 0–32)
AST: 14 IU/L (ref 0–40)
Albumin/Globulin Ratio: 1.5 (ref 1.2–2.2)
Albumin: 4.4 g/dL (ref 3.8–4.9)
Alkaline Phosphatase: 50 IU/L (ref 44–121)
BUN/Creatinine Ratio: 10 (ref 9–23)
BUN: 11 mg/dL (ref 6–24)
Bilirubin Total: 0.4 mg/dL (ref 0.0–1.2)
CO2: 24 mmol/L (ref 20–29)
Calcium: 10.4 mg/dL — ABNORMAL HIGH (ref 8.7–10.2)
Chloride: 104 mmol/L (ref 96–106)
Creatinine, Ser: 1.11 mg/dL — ABNORMAL HIGH (ref 0.57–1.00)
Globulin, Total: 2.9 g/dL (ref 1.5–4.5)
Glucose: 85 mg/dL (ref 70–99)
Potassium: 4.6 mmol/L (ref 3.5–5.2)
Sodium: 141 mmol/L (ref 134–144)
Total Protein: 7.3 g/dL (ref 6.0–8.5)
eGFR: 60 mL/min/{1.73_m2} (ref >59)

## 2022-09-15 DIAGNOSIS — H524 Presbyopia: Secondary | ICD-10-CM | POA: Diagnosis not present

## 2022-11-14 ENCOUNTER — Ambulatory Visit: Payer: BC Managed Care – PPO | Admitting: Internal Medicine

## 2023-03-05 ENCOUNTER — Encounter: Payer: BC Managed Care – PPO | Admitting: Internal Medicine

## 2023-12-14 ENCOUNTER — Other Ambulatory Visit: Payer: Self-pay

## 2023-12-14 ENCOUNTER — Encounter (HOSPITAL_COMMUNITY): Payer: Self-pay

## 2023-12-14 ENCOUNTER — Emergency Department (HOSPITAL_COMMUNITY)
Admission: EM | Admit: 2023-12-14 | Discharge: 2023-12-15 | Disposition: A | Discharge status: Home / Self Care | Payer: MEDICAID | Attending: Emergency Medicine | Admitting: Emergency Medicine

## 2023-12-14 ENCOUNTER — Emergency Department (HOSPITAL_COMMUNITY): Payer: MEDICAID

## 2023-12-14 DIAGNOSIS — S52122A Displaced fracture of head of left radius, initial encounter for closed fracture: Secondary | ICD-10-CM | POA: Insufficient documentation

## 2023-12-14 DIAGNOSIS — W010XXA Fall on same level from slipping, tripping and stumbling without subsequent striking against object, initial encounter: Secondary | ICD-10-CM | POA: Diagnosis not present

## 2023-12-14 DIAGNOSIS — S59902A Unspecified injury of left elbow, initial encounter: Secondary | ICD-10-CM | POA: Diagnosis present

## 2023-12-14 MED ORDER — ACETAMINOPHEN 500 MG PO TABS
1000.0000 mg | ORAL_TABLET | Freq: Once | ORAL | Status: AC
  Administered 2023-12-14: 1000 mg via ORAL
  Filled 2023-12-14: qty 2

## 2023-12-14 MED ORDER — OXYCODONE-ACETAMINOPHEN 5-325 MG PO TABS: 1.0000 | ORAL_TABLET | Freq: Four times a day (QID) | ORAL | 0 refills | Status: DC | PRN

## 2023-12-14 NOTE — Progress Notes (Signed)
 Orthopedic Tech Progress Note Patient Details:  [name] 1971-08-14 [id number]  Long arm splint applied to patients LUE. Sling Immobilizer applied to patients LUE.   Ortho Devices Type of Ortho Device: Long arm splint, Shoulder immobilizer, Ace wrap, Cotton web roll Ortho Device/Splint Location: LUE Ortho Device/Splint Interventions: Ordered, Application, Adjustment   Post Interventions Instructions Provided: Adjustment of device, Care of device  Bernarda JAYSON December 12/14/2023, 11:54 PM

## 2023-12-14 NOTE — ED Provider Notes (Addendum)
 [place] EMERGENCY DEPARTMENT AT [facility] Provider Note   CSN: [id number] Arrival date & time: 12/14/23  1826     History  Chief Complaint  Patient presents with   Arm Injury    [name] is a 53 y.o. female who was moving today, tripped and fell with arm stretched out backward behind her. Pain in the L elbow at this time. No changes in sensation. No pain elsewhere. No anticoagulation.  HPI     Home Medications Prior to Admission medications   Medication Sig Start Date End Date Taking? Authorizing Provider  oxyCODONE -acetaminophen  (PERCOCET/ROXICET) 5-325 MG tablet Take 1 tablet by mouth every 6 (six) hours as needed for severe pain (pain score 7-10). 12/14/23  Yes Josephine Rudnick, Renee SAUNDERS, [name]  ciclopirox  (PENLAC ) 8 % solution Apply topically at bedtime. Apply over nail and surrounding skin. Apply daily over previous coat. After seven (7) days, may remove with alcohol and continue cycle. 05/29/22   Jarold Medici, MD  omeprazole (PRILOSEC) 10 MG capsule Take 10 mg by mouth daily.    [provider]  Semaglutide -Weight Management (WEGOVY ) 2.4 MG/0.75ML SOAJ Inject 2.4 mg into the skin once a week. 08/31/22   Jarold Medici, MD  valsartan -hydrochlorothiazide  (DIOVAN  HCT) 160-12.5 MG tablet Take 1 tablet by mouth daily. 05/29/22 05/29/23  Jarold Medici, MD      Allergies    Patient has no known allergies.    Review of Systems   Review of Systems  Musculoskeletal:        L elbow pain    Physical Exam Updated Vital Signs BP (!) 183/101   Pulse 87   Temp 98.3 F (36.8 C)   Resp 16   Ht 5' 5 (1.651 m)   Wt 127.9 kg   LMP 09/27/2012   SpO2 100%   BMI 46.93 kg/m  Physical Exam Vitals and nursing note reviewed.  Constitutional:      Appearance: She is obese. She is not ill-appearing or toxic-appearing.  HENT:     Head: Normocephalic and atraumatic.  Eyes:     General: No scleral icterus.       Right eye: No discharge.        Left eye: No  discharge.     Conjunctiva/sclera: Conjunctivae normal.  Pulmonary:     Effort: Pulmonary effort is normal.  Chest:     Chest wall: No mass, swelling, tenderness or crepitus.  Abdominal:     Palpations: Abdomen is soft.     Tenderness: There is no abdominal tenderness.  Musculoskeletal:     Right shoulder: Normal.     Left shoulder: Normal.     Right upper arm: Normal.     Left upper arm: Normal.     Right elbow: Normal.     Left elbow: Decreased range of motion. Tenderness present in radial head.     Right forearm: Normal.     Left forearm: Normal.     Right wrist: Normal.     Left wrist: Normal.     Right hand: Normal.     Left hand: Normal.     Cervical back: Normal.     Thoracic back: Normal.     Lumbar back: Normal.     Right hip: Normal.     Left hip: Normal.     Right upper leg: Normal.     Left upper leg: Normal.     Right knee: Normal.     Left knee: Normal.  Right lower leg: Normal.     Left lower leg: Normal.     Right ankle: Normal.     Right Achilles Tendon: Normal.     Left ankle: Normal.     Left Achilles Tendon: Normal.     Right foot: Normal.     Left foot: Normal.  Skin:    General: Skin is warm and dry.  Neurological:     General: No focal deficit present.     Mental Status: She is alert.  Psychiatric:        Mood and Affect: Mood normal.     ED Results / Procedures / Treatments   Labs (all labs ordered are listed, but only abnormal results are displayed) Labs Reviewed - No data to display  EKG None  Radiology DG Forearm Left Result Date: 12/14/2023 CLINICAL DATA:  Pain after fall. EXAM: LEFT FOREARM - 2 VIEW; LEFT ELBOW - COMPLETE 4 VIEW COMPARISON:  None Available. FINDINGS: Mildly displaced radial head fracture. Joint effusion about the elbow. No additional fracture or dislocation. Preserved joint spaces and bone mineralization. IMPRESSION: Radial head fracture.  Joint effusion. Electronically Signed   By: Ranell Bring M.D.   On:  12/14/2023 20:49   DG Elbow Complete Left Result Date: 12/14/2023 CLINICAL DATA:  Pain after fall. EXAM: LEFT FOREARM - 2 VIEW; LEFT ELBOW - COMPLETE 4 VIEW COMPARISON:  None Available. FINDINGS: Mildly displaced radial head fracture. Joint effusion about the elbow. No additional fracture or dislocation. Preserved joint spaces and bone mineralization. IMPRESSION: Radial head fracture.  Joint effusion. Electronically Signed   By: Ranell Bring M.D.   On: 12/14/2023 20:49    Procedures Procedures    Medications Ordered in ED Medications  acetaminophen  (TYLENOL ) tablet 1,000 mg (1,000 mg Oral Given 12/14/23 2314)    ED Course/ Medical Decision Making/ A&P                                 Medical Decision Making 52-y peppered ear-old female with mechanical fall onto outstretched arm  behind her.  Hypertensive on intake vital signs otherwise normal.    Patient is neurovascularly intact.  Tenderness over the left elbow at the radial head.  Amount and/or Complexity of Data Reviewed Radiology: ordered.    Details: Left radial head fracture, mildly displaced with associated joint effusion.  Risk OTC drugs. Prescription drug management.   Will place patient in posterior long-arm splint and have her follow-up with on-call provider for hand specialist Dr. Shari. Neurovascularly intact in arm following splint placement.  [name] voiced understanding of her medical evaluation and treatment plan. Each of their questions answered to their expressed satisfaction.  Return precautions were given.  Patient is well-appearing, stable, and was discharged in good condition.  This chart was dictated using voice recognition software, Dragon. Despite the best efforts of this provider to proofread and correct errors, errors may still occur which can change documentation meaning.         Final Clinical Impression(s) / ED Diagnoses Final diagnoses:  Closed displaced fracture of head of left radius,  initial encounter    Rx / DC Orders ED Discharge Orders          Ordered    oxyCODONE -acetaminophen  (PERCOCET/ROXICET) 5-325 MG tablet  Every 6 hours PRN        12/14/23 2257              [name], Renee  Nunez, [name] 12/14/23 2358    [name], Renee SAUNDERS, [name] 12/14/23 2358    Yolande Lamar BROCKS, MD 12/16/23 [phone]

## 2023-12-14 NOTE — ED Triage Notes (Signed)
 Pt reports she fell today when he foot got caught in a dip in the ground causing her to fall backwards. No head injury, no LOC. She reports pain to left elbow and forearm. + radial pulse, + sensation, able to wiggle fingers.

## 2023-12-14 NOTE — Discharge Instructions (Signed)
 You have a broken bone in your elbow.  Please leave the splint in place at all times until you follow-up with the specialist below.  Please call his office first thing Monday morning and inform them you need to be seen for an ER follow-up for a radial head fracture.  Return to the ER if you develop any numbness tingling weakness discoloration of the hand, or any other new severe symptom.  Take Tylenol  or ibuprofen over-the-counter as needed and may use the prescribed pain medication for more severe breakthrough pain.

## 2023-12-15 NOTE — ED Notes (Signed)
 ..  The patient is A&OX4, ambulatory at d/c with independent steady gait, NAD. Pt verbalized understanding of d/c instructions, prescription and follow up care.

## 2024-05-13 ENCOUNTER — Ambulatory Visit (INDEPENDENT_AMBULATORY_CARE_PROVIDER_SITE_OTHER): Admitting: Internal Medicine

## 2024-05-13 VITALS — BP 140/100 | HR 83 | Temp 97.9°F | Ht 65.0 in | Wt 304.2 lb

## 2024-05-13 DIAGNOSIS — I10 Essential (primary) hypertension: Secondary | ICD-10-CM

## 2024-05-13 DIAGNOSIS — R9431 Abnormal electrocardiogram [ECG] [EKG]: Secondary | ICD-10-CM

## 2024-05-13 DIAGNOSIS — B351 Tinea unguium: Secondary | ICD-10-CM | POA: Diagnosis not present

## 2024-05-13 DIAGNOSIS — H6121 Impacted cerumen, right ear: Secondary | ICD-10-CM | POA: Diagnosis not present

## 2024-05-13 DIAGNOSIS — Z0000 Encounter for general adult medical examination without abnormal findings: Secondary | ICD-10-CM | POA: Diagnosis not present

## 2024-05-13 DIAGNOSIS — E66813 Obesity, class 3: Secondary | ICD-10-CM

## 2024-05-13 DIAGNOSIS — Z6841 Body Mass Index (BMI) 40.0 and over, adult: Secondary | ICD-10-CM

## 2024-05-13 LAB — POCT URINALYSIS DIPSTICK
Blood, UA: NEGATIVE
Glucose, UA: NEGATIVE
Ketones, UA: NEGATIVE
Nitrite, UA: NEGATIVE
Protein, UA: NEGATIVE
Spec Grav, UA: >=1.03 — AB (ref 1.010–1.025)
Urobilinogen, UA: 2 U/dL — AB
pH, UA: 5.5 (ref 5.0–8.0)

## 2024-05-13 MED ORDER — VALSARTAN-HYDROCHLOROTHIAZIDE 160-12.5 MG PO TABS: 1.0000 | ORAL_TABLET | Freq: Every day | ORAL | 2 refills | Status: DC

## 2024-05-13 MED ORDER — WEGOVY 0.25 MG/0.5ML ~~LOC~~ SOAJ: 0.2500 mg | SUBCUTANEOUS | 1 refills | Status: DC

## 2024-05-13 NOTE — Patient Instructions (Signed)

## 2024-05-13 NOTE — Assessment & Plan Note (Signed)

## 2024-05-13 NOTE — Progress Notes (Signed)
 I,Victoria T Basil Lim, CMA,acting as a Neurosurgeon for Smiley Dung, MD.,have documented all relevant documentation on the behalf of Smiley Dung, MD,as directed by  Smiley Dung, MD while in the presence of Smiley Dung, MD.  Subjective:    Patient ID: [name] , female    DOB: [birth year] , 53 y.o.   MRN: [id number]  Chief Complaint  Patient presents with   Annual Exam    Patient presents today for annual exam. She reports compliance with medications. Denies headache chest pain & sob. She states losing her job in 2023. Which took away her insurance & medications needed. She does not take any prescribed medications. Only Prilosec OTC.  GYN: Dr Duke Gibbons. She has pap & mammogram scheduled.     Hypertension    HPI Discussed the use of AI scribe software for clinical note transcription with the patient, who gave verbal consent to proceed.  History of Present Illness [name] is a 53 year old female who presents for an annual physical exam.  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 No issues were noted while previously on the medication. No chest pain or shortness of breath. She has not had an echocardiogram before.  She reports a persistent fungal infection on her toenail, for which she has been using a topical nail polish treatment inconsistently, with no noticeable improvement. She is open to taking oral medication for the condition.  She was previously on Wegovy  for weight loss and reports losing approximately 50 pounds while on the medication. She did not experience any issues with Wegovy .  She has a history of fibroids and underwent a myomectomy in 2013, with no subsequent menstrual cycles or spotting.  She is currently unemployed, awaiting a job offer in IT, and has been without work since May 2024. She recently went through a divorce, resulting in the sale of  her home and a move to an apartment.  No alcohol consumption and reports improved water intake. She has not had any recent mammograms or Pap smears due to insurance issues but is in contact with her GYN for future appointments.   Hypertension This is a chronic problem. The current episode started more than 1 month ago. The problem has been gradually improving since onset. The problem is controlled. Pertinent negatives include no blurred vision, orthopnea or sweats. Risk factors for coronary artery disease include obesity. Past treatments include angiotensin blockers and diuretics. The current treatment provides moderate improvement.     Past Medical History:  Diagnosis Date   Bacterial infection    H/O chlamydia infection    Trichomonas    Yeast infection      Family History  Problem Relation Age of Onset   Healthy Mother    ADD / ADHD Father      Current Outpatient Medications:    Semaglutide -Weight Management (WEGOVY ) 0.25 MG/0.5ML SOAJ, Inject 0.25 mg into the skin once a week., Disp: 2 mL, Rfl: 1   valsartan -hydrochlorothiazide  (DIOVAN  HCT) 160-12.5 MG tablet, Take 1 tablet by mouth daily., Disp: 90 tablet, Rfl: 2   No Known Allergies    The patient states she uses none for birth control. Patient's last menstrual period was 09/27/2012.. Negative for Dysmenorrhea. Negative for: breast discharge, breast lump(s), breast pain and breast self exam. Associated symptoms include abnormal vaginal bleeding. Pertinent negatives include abnormal bleeding (hematology), anxiety, decreased libido, depression,  difficulty falling sleep, dyspareunia, history of infertility, nocturia, sexual dysfunction, sleep disturbances, urinary incontinence, urinary urgency, vaginal discharge and vaginal itching. Diet regular.The patient states her exercise level is  intermittent.  . The patient's tobacco use is:  Social History   Tobacco Use  Smoking Status Never  Smokeless Tobacco Never  . She has been  exposed to passive smoke. The patient's alcohol use is:  Social History   Substance and Sexual Activity  Alcohol Use No  .    Review of Systems  Constitutional: Negative.   HENT: Negative.    Eyes: Negative.  Negative for blurred vision.  Respiratory: Negative.    Cardiovascular: Negative.  Negative for orthopnea.  Gastrointestinal: Negative.   Endocrine: Negative.   Genitourinary: Negative.   Musculoskeletal: Negative.   Skin: Negative.   Allergic/Immunologic: Negative.   Neurological: Negative.   Psychiatric/Behavioral: Negative.       Today's Vitals   05/13/24 0901 05/13/24 0917  BP: (!) 142/98 (!) 140/100  Pulse: 83   Temp: 97.9 F (36.6 C)   SpO2: 98%   Weight: (!) 304 lb 3.2 oz (138 kg)   Height: 5\' 5"  (1.651 m)    Body mass index is 50.62 kg/m.  Wt Readings from Last 3 Encounters:  05/13/24 (!) 304 lb 3.2 oz (138 kg)  12/14/23 282 lb (127.9 kg)  08/31/22 259 lb 12.8 oz (117.8 kg)     Objective:  Physical Exam Vitals and nursing note reviewed.  Constitutional:      Appearance: Normal appearance. She is obese.  HENT:     Head: Normocephalic and atraumatic.     Right Ear: Ear canal and external ear normal. There is impacted cerumen.     Left Ear: Tympanic membrane, ear canal and external ear normal.     Nose: Nose normal.     Mouth/Throat:     Mouth: Mucous membranes are moist.     Pharynx: Oropharynx is clear.  Eyes:     Extraocular Movements: Extraocular movements intact.     Conjunctiva/sclera: Conjunctivae normal.     Pupils: Pupils are equal, round, and reactive to light.  Cardiovascular:     Rate and Rhythm: Normal rate and regular rhythm.     Pulses: Normal pulses.     Heart sounds: Normal heart sounds.  Pulmonary:     Effort: Pulmonary effort is normal.     Breath sounds: Normal breath sounds.  Abdominal:     General: Abdomen is flat. Bowel sounds are normal.     Palpations: Abdomen is soft.  Genitourinary:    Comments:  deferred Musculoskeletal:        General: Normal range of motion.     Cervical back: Normal range of motion and neck supple.  Skin:    General: Skin is warm and dry.  Neurological:     General: No focal deficit present.     Mental Status: She is alert and oriented to person, place, and time.  Psychiatric:        Mood and Affect: Mood normal.        Behavior: Behavior normal.      Assessment And Plan:     Encounter for annual health examination Assessment & Plan: A full exam was performed.  Importance of monthly self breast exams was discussed with the patient.  She is advised to get 30-45 minutes of regular exercise, no less than four to five days per week. Both weight-bearing and aerobic exercises are recommended.  She is  advised to follow a healthy diet with at least six fruits/veggies per day, decrease intake of red meat and other saturated fats and to increase fish intake to twice weekly.  Meats/fish should not be fried -- baked, boiled or broiled is preferable. It is also important to cut back on your sugar intake.  Be sure to read labels - try to avoid anything with added sugar, high fructose corn syrup or other sweeteners.  If you must use a sweetener, you can try stevia or monkfruit.  It is also important to avoid artificially sweetened foods/beverages and diet drinks. Lastly, wear SPF 50 sunscreen on exposed skin and when in direct sunlight for an extended period of time.  Be sure to avoid fast food restaurants and aim for at least 60 ounces of water daily.       Essential hypertension, benign Assessment & Plan: Chronic hypertension with elevated blood pressure after valsartan  discontinuation. EKG performed, NSR w/ low voltage in precordial leads and old anteroseptal infarct. Plan to reintroduce valsartan  cautiously due to potential renal effects. - Renew valsartan  prescription. - Instruct to take half a pill for the first four days. - Schedule nurse visit in two weeks for blood  pressure check and kidney function test. - Schedule regular four-month blood pressure check. - Order echocardiogram to assess cardiac function given abnormal ekg.  Orders: -     EKG 12-Lead -     POCT urinalysis dipstick -     Microalbumin / creatinine urine ratio -     CBC -     CMP14+EGFR -     Lipid panel -     TSH  Right ear impacted cerumen Assessment & Plan: AFTER OBTAINING VERBAL CONSENT, RIGHT EAR WAS FLUSHED BY IRRIGATION. SHE TOLERATED PROCEDURE WELL WITHOUT ANY COMPLICATIONS. NO TM ABNORMALITIES WERE NOTED.   Orders: -     Ear Lavage  Onychomycosis Assessment & Plan: Persistent toenail fungus despite previous topical treatment. Considering oral antifungal treatment, requires liver function monitoring. - Check liver function tests before starting oral antifungal treatment. - Prescribe oral antifungal medication for 12 weeks, recheck liver function at 6 weeks. - Consider referral to podiatrist.   Abnormal EKG -     ECHOCARDIOGRAM COMPLETE; Future  Class 3 severe obesity due to excess calories with body mass index (BMI) of 50.0 to 59.9 in adult Assessment & Plan: Obesity Patient has not met goal of at least 5% of body weight loss with comprehensive lifestyle modifications alone in the past 3-6 months. Pharmacotherapy is appropriate to pursue as augmentation. She has done well with Wegovy  in the past.  Will re-start Wegovy .     Confirmed patient not pregnant and no personal or family history of medullary thyroid  carcinoma (MTC) or Multiple Endocrine Neoplasia syndrome type 2 (MEN 2).    Advised patient on common side effects including nausea, diarrhea, dyspepsia, decreased appetite, and fatigue. Counseled patient on reducing meal size and how to titrate medication to minimize side effects. Patient aware to call if intolerable side effects or if experiencing dehydration, abdominal pain, or dizziness. Patient will adhere to dietary modifications and will target at least 150  minutes of moderate intensity exercise weekly.    Orders: -     Hemoglobin A1c -     Wegovy ; Inject 0.25 mg into the skin once a week.  Dispense: 2 mL; Refill: 1  Other orders -     Valsartan -hydroCHLOROthiazide ; Take 1 tablet by mouth daily.  Dispense: 90 tablet; Refill: 2  Return in 2 weeks (on 05/27/2024), or bp check NV AND lab visit BMP dx: z79.899, for 1 year HM, 4 MONTH BPC.. Patient was given opportunity to ask questions. Patient verbalized understanding of the plan and was able to repeat key elements of the plan. All questions were answered to their satisfaction.   I, Smiley Dung, MD, have reviewed all documentation for this visit. The documentation on 05/13/24 for the exam, diagnosis, procedures, and orders are all accurate and complete.

## 2024-05-14 LAB — CBC
Hematocrit: 39.4 % (ref 34.0–46.6)
Hemoglobin: 13 g/dL (ref 11.1–15.9)
MCH: 28.4 pg (ref 26.6–33.0)
MCHC: 33 g/dL (ref 31.5–35.7)
MCV: 86 fL (ref 79–97)
Platelets: 251 10*3/uL (ref 150–450)
RBC: 4.57 x10E6/uL (ref 3.77–5.28)
RDW: 13.3 % (ref 11.7–15.4)
WBC: 7.6 10*3/uL (ref 3.4–10.8)

## 2024-05-14 LAB — CMP14+EGFR
ALT: 19 IU/L (ref 0–32)
AST: 16 IU/L (ref 0–40)
Albumin: 4.3 g/dL (ref 3.8–4.9)
Alkaline Phosphatase: 66 IU/L (ref 44–121)
BUN/Creatinine Ratio: 11 (ref 9–23)
BUN: 12 mg/dL (ref 6–24)
Bilirubin Total: 0.3 mg/dL (ref 0.0–1.2)
CO2: 19 mmol/L — ABNORMAL LOW (ref 20–29)
Calcium: 10 mg/dL (ref 8.7–10.2)
Chloride: 108 mmol/L — ABNORMAL HIGH (ref 96–106)
Creatinine, Ser: 1.1 mg/dL — ABNORMAL HIGH (ref 0.57–1.00)
Globulin, Total: 3 g/dL (ref 1.5–4.5)
Glucose: 99 mg/dL (ref 70–99)
Potassium: 4.4 mmol/L (ref 3.5–5.2)
Sodium: 141 mmol/L (ref 134–144)
Total Protein: 7.3 g/dL (ref 6.0–8.5)
eGFR: 60 mL/min/{1.73_m2} (ref >59)

## 2024-05-14 LAB — LIPID PANEL
Chol/HDL Ratio: 3.9 ratio (ref 0.0–4.4)
Cholesterol, Total: 182 mg/dL (ref 100–199)
HDL: 47 mg/dL (ref >39)
LDL Chol Calc (NIH): 115 mg/dL — ABNORMAL HIGH (ref 0–99)
Triglycerides: 108 mg/dL (ref 0–149)
VLDL Cholesterol Cal: 20 mg/dL (ref 5–40)

## 2024-05-14 LAB — TSH: TSH: 2.84 u[IU]/mL (ref 0.450–4.500)

## 2024-05-14 LAB — HEMOGLOBIN A1C
Est. average glucose Bld gHb Est-mCnc: 123 mg/dL
Hgb A1c MFr Bld: 5.9 % — ABNORMAL HIGH (ref 4.8–5.6)

## 2024-05-14 LAB — MICROALBUMIN / CREATININE URINE RATIO
Creatinine, Urine: 291.3 mg/dL
Microalb/Creat Ratio: 4 mg/g{creat} (ref 0–29)
Microalbumin, Urine: 10.5 ug/mL

## 2024-05-18 DIAGNOSIS — B351 Tinea unguium: Secondary | ICD-10-CM | POA: Insufficient documentation

## 2024-05-18 DIAGNOSIS — H6121 Impacted cerumen, right ear: Secondary | ICD-10-CM | POA: Insufficient documentation

## 2024-05-18 NOTE — Assessment & Plan Note (Addendum)
 Chronic hypertension with elevated blood pressure after valsartan  discontinuation. EKG performed, NSR w/ low voltage in precordial leads and old anteroseptal infarct. Plan to reintroduce valsartan  cautiously due to potential renal effects. - Renew valsartan  prescription. - Instruct to take half a pill for the first four days. - Schedule nurse visit in two weeks for blood pressure check and kidney function test. - Schedule regular four-month blood pressure check. - Order echocardiogram to assess cardiac function given abnormal ekg.

## 2024-05-18 NOTE — Assessment & Plan Note (Signed)
 Persistent toenail fungus despite previous topical treatment. Considering oral antifungal treatment, requires liver function monitoring. - Check liver function tests before starting oral antifungal treatment. - Prescribe oral antifungal medication for 12 weeks, recheck liver function at 6 weeks. - Consider referral to podiatrist.

## 2024-05-18 NOTE — Assessment & Plan Note (Signed)
AFTER OBTAINING VERBAL CONSENT, RIGHT EAR WAS FLUSHED BY IRRIGATION. SHE TOLERATED PROCEDURE WELL WITHOUT ANY COMPLICATIONS. NO TM ABNORMALITIES WERE NOTED.

## 2024-05-18 NOTE — Assessment & Plan Note (Signed)
 Obesity Patient has not met goal of at least 5% of body weight loss with comprehensive lifestyle modifications alone in the past 3-6 months. Pharmacotherapy is appropriate to pursue as augmentation. She has done well with Wegovy  in the past.  Will re-start Wegovy .     Confirmed patient not pregnant and no personal or family history of medullary thyroid  carcinoma (MTC) or Multiple Endocrine Neoplasia syndrome type 2 (MEN 2).    Advised patient on common side effects including nausea, diarrhea, dyspepsia, decreased appetite, and fatigue. Counseled patient on reducing meal size and how to titrate medication to minimize side effects. Patient aware to call if intolerable side effects or if experiencing dehydration, abdominal pain, or dizziness. Patient will adhere to dietary modifications and will target at least 150 minutes of moderate intensity exercise weekly.

## 2024-05-19 ENCOUNTER — Ambulatory Visit: Payer: Self-pay | Admitting: Internal Medicine

## 2024-05-27 ENCOUNTER — Other Ambulatory Visit: Payer: Self-pay

## 2024-05-27 ENCOUNTER — Ambulatory Visit (HOSPITAL_COMMUNITY)
Admission: RE | Admit: 2024-05-27 | Discharge: 2024-05-27 | Disposition: A | Discharge status: Home / Self Care | Source: Ambulatory Visit | Attending: Cardiovascular Disease | Admitting: Cardiovascular Disease

## 2024-05-27 ENCOUNTER — Ambulatory Visit

## 2024-05-27 VITALS — BP 140/80 | HR 73 | Ht 65.0 in | Wt 304.0 lb

## 2024-05-27 DIAGNOSIS — R9431 Abnormal electrocardiogram [ECG] [EKG]: Secondary | ICD-10-CM

## 2024-05-27 DIAGNOSIS — Z79899 Other long term (current) drug therapy: Secondary | ICD-10-CM

## 2024-05-27 DIAGNOSIS — I10 Essential (primary) hypertension: Secondary | ICD-10-CM

## 2024-05-27 LAB — ECHOCARDIOGRAM COMPLETE
Area-P 1/2: 3.97 cm2
Height: 65 in
S' Lateral: 3.3 cm
Weight: 4864 [oz_av]

## 2024-05-27 MED ORDER — AMLODIPINE BESYLATE 2.5 MG PO TABS: 2.5000 mg | ORAL_TABLET | Freq: Every day | ORAL | 2 refills | Status: DC

## 2024-05-27 NOTE — Patient Instructions (Signed)
 Hypertension, Adult Hypertension is another name for high blood pressure. High blood pressure forces your heart to work harder to pump blood. This can cause problems over time. There are two numbers in a blood pressure reading. There is a top number (systolic) over a bottom number (diastolic). It is best to have a blood pressure that is below 120/80. What are the causes? The cause of this condition is not known. Some other conditions can lead to high blood pressure. What increases the risk? Some lifestyle factors can make you more likely to develop high blood pressure: Smoking. Not getting enough exercise or physical activity. Being overweight. Having too much fat, sugar, calories, or salt (sodium) in your diet. Drinking too much alcohol. Other risk factors include: Having any of these conditions: Heart disease. Diabetes. High cholesterol. Kidney disease. Obstructive sleep apnea. Having a family history of high blood pressure and high cholesterol. Age. The risk increases with age. Stress. What are the signs or symptoms? High blood pressure may not cause symptoms. Very high blood pressure (hypertensive crisis) may cause: Headache. Fast or uneven heartbeats (palpitations). Shortness of breath. Nosebleed. Vomiting or feeling like you may vomit (nauseous). Changes in how you see. Very bad chest pain. Feeling dizzy. Seizures. How is this treated? This condition is treated by making healthy lifestyle changes, such as: Eating healthy foods. Exercising more. Drinking less alcohol. Your doctor may prescribe medicine if lifestyle changes do not help enough and if: Your top number is above 130. Your bottom number is above 80. Your personal target blood pressure may vary. Follow these instructions at home: Eating and drinking  If told, follow the DASH eating plan. To follow this plan: Fill one half of your plate at each meal with fruits and vegetables. Fill one fourth of your plate  at each meal with whole grains. Whole grains include whole-wheat pasta, brown rice, and whole-grain bread. Eat or drink low-fat dairy products, such as skim milk or low-fat yogurt. Fill one fourth of your plate at each meal with low-fat (lean) proteins. Low-fat proteins include fish, chicken without skin, eggs, beans, and tofu. Avoid fatty meat, cured and processed meat, or chicken with skin. Avoid pre-made or processed food. Limit the amount of salt in your diet to less than 1,500 mg each day. Do not drink alcohol if: Your doctor tells you not to drink. You are pregnant, may be pregnant, or are planning to become pregnant. If you drink alcohol: Limit how much you have to: 0-1 drink a day for women. 0-2 drinks a day for men. Know how much alcohol is in your drink. In the [place] one drink equals one 12 oz bottle of beer (355 mL), one 5 oz glass of wine (148 mL), or one 1 oz glass of hard liquor (44 mL). Lifestyle  Work with your doctor to stay at a healthy weight or to lose weight. Ask your doctor what the best weight is for you. Get at least 30 minutes of exercise that causes your heart to beat faster (aerobic exercise) most days of the week. This may include walking, swimming, or biking. Get at least 30 minutes of exercise that strengthens your muscles (resistance exercise) at least 3 days a week. This may include lifting weights or doing Pilates. Do not smoke or use any products that contain nicotine or tobacco. If you need help quitting, ask your doctor. Check your blood pressure at home as told by your doctor. Keep all follow-up visits. Medicines Take over-the-counter and prescription medicines  only as told by your doctor. Follow directions carefully. Do not skip doses of blood pressure medicine. The medicine does not work as well if you skip doses. Skipping doses also puts you at risk for problems. Ask your doctor about side effects or reactions to medicines that you should watch  for. Contact a doctor if: You think you are having a reaction to the medicine you are taking. You have headaches that keep coming back. You feel dizzy. You have swelling in your ankles. You have trouble with your vision. Get help right away if: You get a very bad headache. You start to feel mixed up (confused). You feel weak or numb. You feel faint. You have very bad pain in your: Chest. Belly (abdomen). You vomit more than once. You have trouble breathing. These symptoms may be an emergency. Get help right away. Call 911. Do not wait to see if the symptoms will go away. Do not drive yourself to the hospital. Summary Hypertension is another name for high blood pressure. High blood pressure forces your heart to work harder to pump blood. For most people, a normal blood pressure is less than 120/80. Making healthy choices can help lower blood pressure. If your blood pressure does not get lower with healthy choices, you may need to take medicine. This information is not intended to replace advice given to you by your health care provider. Make sure you discuss any questions you have with your health care provider. Document Revised: 09/15/2021 Document Reviewed: 09/15/2021 Elsevier Patient Education  2024 [employer].

## 2024-05-27 NOTE — Progress Notes (Signed)
 Patient presents today for a bpc. Patient reports compliance with her meds. Patient reports she takes her valsartan  hydrochlorothiazide  160-12.5mg  in the mornings. I checked her bp and it was 134/80 P80. I had patient wait 10 minutes and rechecked her bp and it was 140/80 P73. Patient was advised she is to continue taking valsartan  hydrochlorothiazide  160-12.5mg  in the morning and Dr.Sanders is going to add amlodipine 2.5mg  in the evenings. Patient is to follow up with us  in  2 weeks for a [place] bpc. YL,RMA    BP Readings from Last 3 Encounters:  05/13/24 (!) 140/100  12/14/23 (!) 183/101  08/31/22 132/84

## 2024-05-28 LAB — BMP8+EGFR
BUN/Creatinine Ratio: 13 (ref 9–23)
BUN: 13 mg/dL (ref 6–24)
CO2: 18 mmol/L — ABNORMAL LOW (ref 20–29)
Calcium: 10.2 mg/dL (ref 8.7–10.2)
Chloride: 107 mmol/L — ABNORMAL HIGH (ref 96–106)
Creatinine, Ser: 1.02 mg/dL — ABNORMAL HIGH (ref 0.57–1.00)
Glucose: 82 mg/dL (ref 70–99)
Potassium: 4.3 mmol/L (ref 3.5–5.2)
Sodium: 143 mmol/L (ref 134–144)
eGFR: 66 mL/min/{1.73_m2}

## 2024-05-29 ENCOUNTER — Other Ambulatory Visit: Payer: Self-pay | Admitting: Internal Medicine

## 2024-05-29 ENCOUNTER — Ambulatory Visit: Payer: Self-pay | Admitting: Internal Medicine

## 2024-05-29 DIAGNOSIS — Z6841 Body Mass Index (BMI) 40.0 and over, adult: Secondary | ICD-10-CM

## 2024-06-10 ENCOUNTER — Ambulatory Visit

## 2024-06-10 NOTE — Progress Notes (Addendum)
 Patient presents today for bp check. She is currently taking Amlodipine  2.5mg  at night and Valstartan-hydrochlorothiazide  160-12.5 in the morning. Patient admits to not taking medication this morning. Medication compliance teaching performed with patient and she expressed understanding. Patients original reading was 144/88. Provider would like for patient to return in 4 weeks to recheck blood pressure. Appointment scheduled. Diet and exercised importance discussed with patient as well.  BP Readings from Last 3 Encounters:  06/10/24 (!) 142/82  05/27/24 (!) 140/80  05/13/24 (!) 140/100

## 2024-06-15 ENCOUNTER — Other Ambulatory Visit: Payer: Self-pay | Admitting: Internal Medicine

## 2024-06-15 DIAGNOSIS — E66813 Obesity, class 3: Secondary | ICD-10-CM

## 2024-07-08 ENCOUNTER — Ambulatory Visit

## 2024-07-15 ENCOUNTER — Ambulatory Visit

## 2024-07-29 ENCOUNTER — Ambulatory Visit

## 2024-07-29 VITALS — BP 140/86 | HR 75 | Temp 98.5°F | Ht 65.0 in | Wt 303.0 lb

## 2024-07-29 DIAGNOSIS — I10 Essential (primary) hypertension: Secondary | ICD-10-CM

## 2024-07-29 MED ORDER — AMLODIPINE BESYLATE 5 MG PO TABS: 5.0000 mg | ORAL_TABLET | Freq: Every day | ORAL | 1 refills | Status: DC

## 2024-07-29 NOTE — Progress Notes (Signed)
 Patient is in office today for a nurse visit for Blood Pressure Check. Patient taking amLODipine  2.5mg  PM and Valsartan -hydrochlorothiazide  160-12.5mg  AM. Patient blood pressure was 140/90, Patient No chest pain, No shortness of breath, No dyspnea on exertion, No orthopnea, No paroxysmal nocturnal dyspnea, No edema, No palpitations, No syncope BP Readings from Last 3 Encounters:  07/29/24 (!) 140/90  06/10/24 (!) 142/82  05/27/24 (!) 140/80   Per provider- increase amLODipine  to 5mg  nightly, f/u 6 weeks BPC

## 2024-09-09 ENCOUNTER — Encounter: Payer: Self-pay | Admitting: Internal Medicine

## 2024-09-09 ENCOUNTER — Ambulatory Visit (INDEPENDENT_AMBULATORY_CARE_PROVIDER_SITE_OTHER): Payer: Self-pay | Admitting: Internal Medicine

## 2024-09-09 ENCOUNTER — Other Ambulatory Visit: Payer: Self-pay | Admitting: Internal Medicine

## 2024-09-09 VITALS — BP 130/84 | HR 85 | Temp 98.4°F | Ht 65.0 in | Wt 307.4 lb

## 2024-09-09 DIAGNOSIS — I10 Essential (primary) hypertension: Secondary | ICD-10-CM | POA: Diagnosis not present

## 2024-09-09 DIAGNOSIS — E559 Vitamin D deficiency, unspecified: Secondary | ICD-10-CM

## 2024-09-09 DIAGNOSIS — Z6841 Body Mass Index (BMI) 40.0 and over, adult: Secondary | ICD-10-CM

## 2024-09-09 DIAGNOSIS — R7309 Other abnormal glucose: Secondary | ICD-10-CM

## 2024-09-09 DIAGNOSIS — B351 Tinea unguium: Secondary | ICD-10-CM

## 2024-09-09 DIAGNOSIS — E66813 Obesity, class 3: Secondary | ICD-10-CM

## 2024-09-09 MED ORDER — TERBINAFINE HCL 250 MG PO TABS: 250.0000 mg | ORAL_TABLET | Freq: Every day | ORAL | 0 refills | Status: DC

## 2024-09-09 MED ORDER — TERBINAFINE HCL 250 MG PO TABS
250.0000 mg | ORAL_TABLET | Freq: Every day | ORAL | 0 refills | Status: DC
Start: 2024-09-09 — End: 2024-09-09

## 2024-09-09 NOTE — Progress Notes (Signed)
 LILLETTE Jaquelyn PARAS [place],acting as a Neurosurgeon for Catheryn LOISE Slocumb, MD.,have documented all relevant documentation on the behalf of Catheryn LOISE Slocumb, MD,as directed by  Catheryn LOISE Slocumb, MD while in the presence of Catheryn LOISE Slocumb, MD.    Subjective:     Patient ID: [name] , female    DOB: Dec 24, 1970 , 53 y.o.   MRN: [id number]   Chief Complaint  Patient presents with   Hypertension    Patient presents today for bpc. She reports compliance with medications. Denies headache, chest pain & sob.  She wants to know since insurance no longer covers Wegovy . Is it something she can take in place of it.  She also has to find new GYN. Prev does not take her insurance.     HPI Discussed the use of AI scribe software for clinical note transcription with the patient, who gave verbal consent to proceed.  History of Present Illness [name] is a 53 year old female with hypertension who presents for a follow-up visit.  She is not currently engaging in regular exercise due to her work schedule and her son's Paramedic. She plans to start a gym routine once the football season ends.  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 Her work arrangement does not provide benefits, and she remains covered by Medicaid. Her schedule allows some flexibility for medical appointments, as her employer permits her to work from home on days when she has appointments.  She is overdue for a mammogram and Pap smear, as her previous gynecologist no longer accepts Medicaid. She is considering scheduling these screenings at a breast center or mobile unit and prefers appointments in the mid-morning to accommodate her work schedule.  She inquires about alternatives to Wegovy  for weight management, as Medicaid denied coverage for injectables. She recalls being referred to a weight loss clinic previously, but the upfront cost was prohibitive. She is currently  focusing on managing her diet and exercise independently due to financial constraints.  She discussed the need for liver function tests as she is considering starting Lamisil for her nail and foot condition. She does not consume alcohol.  Her son plays football and attends [employer] in [place]. She manages her schedule around his school and practice commitments, with her mother assisting in picking him up from school.   Hypertension This is a chronic problem. The current episode started more than 1 month ago. The problem has been gradually improving since onset. The problem is controlled. Pertinent negatives include no blurred vision, orthopnea or sweats. Risk factors for coronary artery disease include obesity. Past treatments include angiotensin blockers and diuretics. The current treatment provides moderate improvement.     Past Medical History:  Diagnosis Date   Bacterial infection    GERD (gastroesophageal reflux disease)    H/O chlamydia infection    Hypertension    Trichomonas    Yeast infection      Family History  Problem Relation Age of Onset   Healthy Mother    ADD / ADHD Father      Current Outpatient Medications:    amLODipine  (NORVASC ) 5 MG tablet, Take 1 tablet (5 mg total) by mouth daily., Disp: 30 tablet, Rfl: 1   valsartan -hydrochlorothiazide  (DIOVAN  HCT) 160-12.5 MG tablet, Take 1 tablet by mouth daily., Disp: 90 tablet, Rfl: 2   terbinafine (LAMISIL) 250 MG tablet, Take 1 tablet (250 mg total) by mouth daily., Disp: 30 tablet,  Rfl: 0   Vitamin D , Ergocalciferol , (DRISDOL ) 1.25 MG (50000 UNIT) CAPS capsule, Take 1 capsule (50,000 Units total) by mouth every 7 (seven) days., Disp: 12 capsule, Rfl: 1   No Known Allergies   Review of Systems  Constitutional: Negative.   Eyes: Negative.  Negative for blurred vision.  Respiratory: Negative.    Cardiovascular: Negative.  Negative for orthopnea.  Musculoskeletal: Negative.   Skin: Negative.    Neurological: Negative.   Psychiatric/Behavioral: Negative.       Today's Vitals   09/09/24 1619  BP: 130/84  Pulse: 85  Temp: 98.4 F (36.9 C)  SpO2: 98%  Weight: (!) 307 lb 6.4 oz (139.4 kg)  Height: 5' 5 (1.651 m)   Body mass index is 51.15 kg/m.  Wt Readings from Last 3 Encounters:  09/09/24 (!) 307 lb 6.4 oz (139.4 kg)  07/29/24 (!) 303 lb (137.4 kg)  06/10/24 (!) 303 lb (137.4 kg)     Objective:  Physical Exam Vitals and nursing note reviewed.  Constitutional:      Appearance: Normal appearance. She is obese.  HENT:     Head: Normocephalic and atraumatic.  Eyes:     Extraocular Movements: Extraocular movements intact.  Cardiovascular:     Rate and Rhythm: Normal rate and regular rhythm.     Heart sounds: Normal heart sounds.  Pulmonary:     Effort: Pulmonary effort is normal.     Breath sounds: Normal breath sounds.  Musculoskeletal:     Cervical back: Normal range of motion.     Right lower leg: Edema present.     Left lower leg: Edema present.  Skin:    General: Skin is warm.  Neurological:     General: No focal deficit present.     Mental Status: She is alert.  Psychiatric:        Mood and Affect: Mood normal.        Behavior: Behavior normal.         Assessment And Plan:     Essential hypertension, benign Assessment & Plan: Chronic, fair control.  Goal BP<130/80.  She wll continue with amlodipine  5mg  daily and valsartan /hydrochlorothiazide  160/12.5mg  daily.  - Follow low sodum diet - Incorporate more exercise into your daily routine, aiming for at least 150 minutes of exercise per week.  Orders: -     CMP14+EGFR  Onychomycosis Assessment & Plan: Onychomycosis present. Discussed potential liver issues with Lamisil; absence of alcohol consumption is favorable. Insurance requires monthly liver function monitoring. - Order liver function tests. - Prescribe Lamisil pending liver function test results. - Schedule follow-up lab visit in four  weeks for liver function monitoring.   Vitamin D  deficiency disease Assessment & Plan: I WILL CHECK A VIT D LEVEL AND SUPPLEMENT AS NEEDED.  ALSO ENCOURAGED TO SPEND 15 MINUTES IN THE SUN DAILY.   Orders: -     VITAMIN D  25 Hydroxy (Vit-D Deficiency, Fractures)  Elevated hemoglobin A1c Assessment & Plan: Previous labs reviewed, her A1c has been elevated in the past. I will check an A1c today. Reminded to avoid refined sugars including sugary drinks/foods and processed meats including bacon, sausages and deli meats.    Orders: -     Hemoglobin A1c  Class 3 severe obesity due to excess calories with body mass index (BMI) of 50.0 to 59.9 in adult Assessment & Plan: BMI 51.  Obesity management hindered by Medicaid denial of Wegovy  and other injectables. She opts for diet and exercise due to financial  constraints. - Encourage exercise and dietary modifications. - Consider referral to weight loss clinic if financially feasible.   Class 3 severe obesity due to excess calories with serious comorbidity and body mass index (BMI) of 50.0 to 59.9 in adult [facility]) [Z33.186, Z68.43] Assessment & Plan: BMI 51.  Obesity management hindered by Medicaid denial of Wegovy  and other injectables. She opts for diet and exercise due to financial constraints. - Encourage exercise and dietary modifications. - Consider referral to weight loss clinic if financially feasible.   Other orders -     Terbinafine HCl; Take 1 tablet (250 mg total) by mouth daily.  Dispense: 30 tablet; Refill: 0   Patient was given opportunity to ask questions. Patient verbalized understanding of the plan and was able to repeat key elements of the plan. All questions were answered to their satisfaction.   I, Catheryn LOISE Slocumb, MD, have reviewed all documentation for this visit. The documentation on 08/31/22 for the exam, diagnosis, procedures, and orders are all accurate and complete.   IF YOU HAVE BEEN REFERRED TO A SPECIALIST, IT  MAY TAKE 1-2 WEEKS TO SCHEDULE/PROCESS THE REFERRAL. IF YOU HAVE NOT HEARD FROM US /SPECIALIST IN TWO WEEKS, PLEASE GIVE US  A CALL AT [phone].   THE PATIENT IS ENCOURAGED TO PRACTICE SOCIAL DISTANCING DUE TO THE COVID-19 PANDEMIC.

## 2024-09-09 NOTE — Patient Instructions (Signed)
 Hypertension, Adult Hypertension is another name for high blood pressure. High blood pressure forces your heart to work harder to pump blood. This can cause problems over time. There are two numbers in a blood pressure reading. There is a top number (systolic) over a bottom number (diastolic). It is best to have a blood pressure that is below 120/80. What are the causes? The cause of this condition is not known. Some other conditions can lead to high blood pressure. What increases the risk? Some lifestyle factors can make you more likely to develop high blood pressure: Smoking. Not getting enough exercise or physical activity. Being overweight. Having too much fat, sugar, calories, or salt (sodium) in your diet. Drinking too much alcohol. Other risk factors include: Having any of these conditions: Heart disease. Diabetes. High cholesterol. Kidney disease. Obstructive sleep apnea. Having a family history of high blood pressure and high cholesterol. Age. The risk increases with age. Stress. What are the signs or symptoms? High blood pressure may not cause symptoms. Very high blood pressure (hypertensive crisis) may cause: Headache. Fast or uneven heartbeats (palpitations). Shortness of breath. Nosebleed. Vomiting or feeling like you may vomit (nauseous). Changes in how you see. Very bad chest pain. Feeling dizzy. Seizures. How is this treated? This condition is treated by making healthy lifestyle changes, such as: Eating healthy foods. Exercising more. Drinking less alcohol. Your doctor may prescribe medicine if lifestyle changes do not help enough and if: Your top number is above 130. Your bottom number is above 80. Your personal target blood pressure may vary. Follow these instructions at home: Eating and drinking  If told, follow the DASH eating plan. To follow this plan: Fill one half of your plate at each meal with fruits and vegetables. Fill one fourth of your plate  at each meal with whole grains. Whole grains include whole-wheat pasta, brown rice, and whole-grain bread. Eat or drink low-fat dairy products, such as skim milk or low-fat yogurt. Fill one fourth of your plate at each meal with low-fat (lean) proteins. Low-fat proteins include fish, chicken without skin, eggs, beans, and tofu. Avoid fatty meat, cured and processed meat, or chicken with skin. Avoid pre-made or processed food. Limit the amount of salt in your diet to less than 1,500 mg each day. Do not drink alcohol if: Your doctor tells you not to drink. You are pregnant, may be pregnant, or are planning to become pregnant. If you drink alcohol: Limit how much you have to: 0-1 drink a day for women. 0-2 drinks a day for men. Know how much alcohol is in your drink. In the [place] one drink equals one 12 oz bottle of beer (355 mL), one 5 oz glass of wine (148 mL), or one 1 oz glass of hard liquor (44 mL). Lifestyle  Work with your doctor to stay at a healthy weight or to lose weight. Ask your doctor what the best weight is for you. Get at least 30 minutes of exercise that causes your heart to beat faster (aerobic exercise) most days of the week. This may include walking, swimming, or biking. Get at least 30 minutes of exercise that strengthens your muscles (resistance exercise) at least 3 days a week. This may include lifting weights or doing Pilates. Do not smoke or use any products that contain nicotine or tobacco. If you need help quitting, ask your doctor. Check your blood pressure at home as told by your doctor. Keep all follow-up visits. Medicines Take over-the-counter and prescription medicines  only as told by your doctor. Follow directions carefully. Do not skip doses of blood pressure medicine. The medicine does not work as well if you skip doses. Skipping doses also puts you at risk for problems. Ask your doctor about side effects or reactions to medicines that you should watch  for. Contact a doctor if: You think you are having a reaction to the medicine you are taking. You have headaches that keep coming back. You feel dizzy. You have swelling in your ankles. You have trouble with your vision. Get help right away if: You get a very bad headache. You start to feel mixed up (confused). You feel weak or numb. You feel faint. You have very bad pain in your: Chest. Belly (abdomen). You vomit more than once. You have trouble breathing. These symptoms may be an emergency. Get help right away. Call 911. Do not wait to see if the symptoms will go away. Do not drive yourself to the hospital. Summary Hypertension is another name for high blood pressure. High blood pressure forces your heart to work harder to pump blood. For most people, a normal blood pressure is less than 120/80. Making healthy choices can help lower blood pressure. If your blood pressure does not get lower with healthy choices, you may need to take medicine. This information is not intended to replace advice given to you by your health care provider. Make sure you discuss any questions you have with your health care provider. Document Revised: 09/15/2021 Document Reviewed: 09/15/2021 Elsevier Patient Education  2024 [employer].

## 2024-09-10 LAB — CMP14+EGFR
ALT: 18 IU/L (ref 0–32)
AST: 15 IU/L (ref 0–40)
Albumin: 4.5 g/dL (ref 3.8–4.9)
Alkaline Phosphatase: 63 IU/L (ref 49–135)
BUN/Creatinine Ratio: 12 (ref 9–23)
BUN: 11 mg/dL (ref 6–24)
Bilirubin Total: 0.3 mg/dL (ref 0.0–1.2)
CO2: 21 mmol/L (ref 20–29)
Calcium: 10.4 mg/dL — ABNORMAL HIGH (ref 8.7–10.2)
Chloride: 105 mmol/L (ref 96–106)
Creatinine, Ser: 0.92 mg/dL (ref 0.57–1.00)
Globulin, Total: 2.8 g/dL (ref 1.5–4.5)
Glucose: 112 mg/dL — ABNORMAL HIGH (ref 70–99)
Potassium: 4.1 mmol/L (ref 3.5–5.2)
Sodium: 141 mmol/L (ref 134–144)
Total Protein: 7.3 g/dL (ref 6.0–8.5)
eGFR: 74 mL/min/1.73 (ref >59)

## 2024-09-10 LAB — VITAMIN D 25 HYDROXY (VIT D DEFICIENCY, FRACTURES): Vit D, 25-Hydroxy: 10 ng/mL — ABNORMAL LOW (ref 30.0–100.0)

## 2024-09-10 LAB — HEMOGLOBIN A1C
Est. average glucose Bld gHb Est-mCnc: 128 mg/dL
Hgb A1c MFr Bld: 6.1 % — ABNORMAL HIGH (ref 4.8–5.6)

## 2024-09-13 ENCOUNTER — Ambulatory Visit: Payer: Self-pay | Admitting: Internal Medicine

## 2024-09-13 ENCOUNTER — Other Ambulatory Visit: Payer: Self-pay | Admitting: Internal Medicine

## 2024-09-13 MED ORDER — VITAMIN D (ERGOCALCIFEROL) 1.25 MG (50000 UNIT) PO CAPS: 50000.0000 [IU] | ORAL_CAPSULE | ORAL | 1 refills | Status: AC

## 2024-09-14 DIAGNOSIS — E559 Vitamin D deficiency, unspecified: Secondary | ICD-10-CM | POA: Insufficient documentation

## 2024-09-14 DIAGNOSIS — R7309 Other abnormal glucose: Secondary | ICD-10-CM | POA: Insufficient documentation

## 2024-09-14 NOTE — Assessment & Plan Note (Signed)
 Onychomycosis present. Discussed potential liver issues with Lamisil; absence of alcohol consumption is favorable. Insurance requires monthly liver function monitoring. - Order liver function tests. - Prescribe Lamisil pending liver function test results. - Schedule follow-up lab visit in four weeks for liver function monitoring.

## 2024-09-14 NOTE — Assessment & Plan Note (Signed)
 BMI 51.  Obesity management hindered by Medicaid denial of Wegovy  and other injectables. She opts for diet and exercise due to financial constraints. - Encourage exercise and dietary modifications. - Consider referral to weight loss clinic if financially feasible.

## 2024-09-14 NOTE — Assessment & Plan Note (Signed)
 I WILL CHECK A VIT D LEVEL AND SUPPLEMENT AS NEEDED.  ALSO ENCOURAGED TO SPEND 15 MINUTES IN THE SUN DAILY.

## 2024-09-14 NOTE — Assessment & Plan Note (Signed)
 Previous labs reviewed, her A1c has been elevated in the past. I will check an A1c today. Reminded to avoid refined sugars including sugary drinks/foods and processed meats including bacon, sausages and deli meats.

## 2024-09-14 NOTE — Assessment & Plan Note (Signed)
 Chronic, fair control.  Goal BP<130/80.  She wll continue with amlodipine  5mg  daily and valsartan /hydrochlorothiazide  160/12.5mg  daily.  - Follow low sodum diet - Incorporate more exercise into your daily routine, aiming for at least 150 minutes of exercise per week.

## 2024-10-02 ENCOUNTER — Ambulatory Visit: Payer: Self-pay | Admitting: Internal Medicine

## 2024-10-02 ENCOUNTER — Other Ambulatory Visit

## 2024-10-02 ENCOUNTER — Other Ambulatory Visit: Payer: Self-pay | Admitting: Internal Medicine

## 2024-10-02 DIAGNOSIS — Z79899 Other long term (current) drug therapy: Secondary | ICD-10-CM

## 2024-10-02 NOTE — Patient Instructions (Incomplete)
 Hypertension, Adult Hypertension is another name for high blood pressure. High blood pressure forces your heart to work harder to pump blood. This can cause problems over time. There are two numbers in a blood pressure reading. There is a top number (systolic) over a bottom number (diastolic). It is best to have a blood pressure that is below 120/80. What are the causes? The cause of this condition is not known. Some other conditions can lead to high blood pressure. What increases the risk? Some lifestyle factors can make you more likely to develop high blood pressure: Smoking. Not getting enough exercise or physical activity. Being overweight. Having too much fat, sugar, calories, or salt (sodium) in your diet. Drinking too much alcohol. Other risk factors include: Having any of these conditions: Heart disease. Diabetes. High cholesterol. Kidney disease. Obstructive sleep apnea. Having a family history of high blood pressure and high cholesterol. Age. The risk increases with age. Stress. What are the signs or symptoms? High blood pressure may not cause symptoms. Very high blood pressure (hypertensive crisis) may cause: Headache. Fast or uneven heartbeats (palpitations). Shortness of breath. Nosebleed. Vomiting or feeling like you may vomit (nauseous). Changes in how you see. Very bad chest pain. Feeling dizzy. Seizures. How is this treated? This condition is treated by making healthy lifestyle changes, such as: Eating healthy foods. Exercising more. Drinking less alcohol. Your doctor may prescribe medicine if lifestyle changes do not help enough and if: Your top number is above 130. Your bottom number is above 80. Your personal target blood pressure may vary. Follow these instructions at home: Eating and drinking  If told, follow the DASH eating plan. To follow this plan: Fill one half of your plate at each meal with fruits and vegetables. Fill one fourth of your plate  at each meal with whole grains. Whole grains include whole-wheat pasta, brown rice, and whole-grain bread. Eat or drink low-fat dairy products, such as skim milk or low-fat yogurt. Fill one fourth of your plate at each meal with low-fat (lean) proteins. Low-fat proteins include fish, chicken without skin, eggs, beans, and tofu. Avoid fatty meat, cured and processed meat, or chicken with skin. Avoid pre-made or processed food. Limit the amount of salt in your diet to less than 1,500 mg each day. Do not drink alcohol if: Your doctor tells you not to drink. You are pregnant, may be pregnant, or are planning to become pregnant. If you drink alcohol: Limit how much you have to: 0-1 drink a day for women. 0-2 drinks a day for men. Know how much alcohol is in your drink. In the [place] one drink equals one 12 oz bottle of beer (355 mL), one 5 oz glass of wine (148 mL), or one 1 oz glass of hard liquor (44 mL). Lifestyle  Work with your doctor to stay at a healthy weight or to lose weight. Ask your doctor what the best weight is for you. Get at least 30 minutes of exercise that causes your heart to beat faster (aerobic exercise) most days of the week. This may include walking, swimming, or biking. Get at least 30 minutes of exercise that strengthens your muscles (resistance exercise) at least 3 days a week. This may include lifting weights or doing Pilates. Do not smoke or use any products that contain nicotine or tobacco. If you need help quitting, ask your doctor. Check your blood pressure at home as told by your doctor. Keep all follow-up visits. Medicines Take over-the-counter and prescription medicines  only as told by your doctor. Follow directions carefully. Do not skip doses of blood pressure medicine. The medicine does not work as well if you skip doses. Skipping doses also puts you at risk for problems. Ask your doctor about side effects or reactions to medicines that you should watch  for. Contact a doctor if: You think you are having a reaction to the medicine you are taking. You have headaches that keep coming back. You feel dizzy. You have swelling in your ankles. You have trouble with your vision. Get help right away if: You get a very bad headache. You start to feel mixed up (confused). You feel weak or numb. You feel faint. You have very bad pain in your: Chest. Belly (abdomen). You vomit more than once. You have trouble breathing. These symptoms may be an emergency. Get help right away. Call 911. Do not wait to see if the symptoms will go away. Do not drive yourself to the hospital. Summary Hypertension is another name for high blood pressure. High blood pressure forces your heart to work harder to pump blood. For most people, a normal blood pressure is less than 120/80. Making healthy choices can help lower blood pressure. If your blood pressure does not get lower with healthy choices, you may need to take medicine. This information is not intended to replace advice given to you by your health care provider. Make sure you discuss any questions you have with your health care provider. Document Revised: 09/15/2021 Document Reviewed: 09/15/2021 Elsevier Patient Education  2024 [employer].

## 2024-10-02 NOTE — Progress Notes (Deleted)
 I,Erique Kaser T Emmitt, CMA,acting as a Neurosurgeon for Catheryn LOISE Slocumb, MD.,have documented all relevant documentation on the behalf of Catheryn LOISE Slocumb, MD,as directed by  Catheryn LOISE Slocumb, MD while in the presence of Catheryn LOISE Slocumb, MD.  Subjective:  Patient ID: [name] , female    DOB: October 01, 1971 , 53 y.o.   MRN: [id number]  No chief complaint on file.   HPI  HPI   Past Medical History:  Diagnosis Date   Bacterial infection    GERD (gastroesophageal reflux disease)    H/O chlamydia infection    Hypertension    Trichomonas    Yeast infection      Family History  Problem Relation Age of Onset   Healthy Mother    ADD / ADHD Father      Current Outpatient Medications:    amLODipine  (NORVASC ) 5 MG tablet, Take 1 tablet (5 mg total) by mouth daily., Disp: 30 tablet, Rfl: 1   terbinafine (LAMISIL) 250 MG tablet, Take 1 tablet (250 mg total) by mouth daily., Disp: 30 tablet, Rfl: 0   valsartan -hydrochlorothiazide  (DIOVAN  HCT) 160-12.5 MG tablet, Take 1 tablet by mouth daily., Disp: 90 tablet, Rfl: 2   Vitamin D , Ergocalciferol , (DRISDOL ) 1.25 MG (50000 UNIT) CAPS capsule, Take 1 capsule (50,000 Units total) by mouth every 7 (seven) days., Disp: 12 capsule, Rfl: 1   No Known Allergies   Review of Systems  Constitutional: Negative.   Respiratory: Negative.    Cardiovascular: Negative.   Neurological: Negative.   Psychiatric/Behavioral: Negative.       There were no vitals filed for this visit. There is no height or weight on file to calculate BMI.  Wt Readings from Last 3 Encounters:  09/09/24 (!) 307 lb 6.4 oz (139.4 kg)  07/29/24 (!) 303 lb (137.4 kg)  06/10/24 (!) 303 lb (137.4 kg)     Objective:  Physical Exam      Assessment And Plan:  There are no diagnoses linked to this encounter.   No follow-ups on file.  Patient was given opportunity to ask questions. Patient verbalized understanding of the plan and was able to repeat key elements of the plan. All  questions were answered to their satisfaction.  Catheryn LOISE Slocumb, MD  I, Catheryn LOISE Slocumb, MD, have reviewed all documentation for this visit. The documentation on 10/02/24 for the exam, diagnosis, procedures, and orders are all accurate and complete.   IF YOU HAVE BEEN REFERRED TO A SPECIALIST, IT MAY TAKE 1-2 WEEKS TO SCHEDULE/PROCESS THE REFERRAL. IF YOU HAVE NOT HEARD FROM US /SPECIALIST IN TWO WEEKS, PLEASE GIVE US  A CALL AT [phone] X [phone].   THE PATIENT IS ENCOURAGED TO PRACTICE SOCIAL DISTANCING DUE TO THE COVID-19 PANDEMIC.

## 2024-10-02 NOTE — Progress Notes (Signed)
 Visit converted to lab visit. ----

## 2024-10-03 ENCOUNTER — Ambulatory Visit: Payer: Self-pay | Admitting: Internal Medicine

## 2024-10-03 LAB — HEPATIC FUNCTION PANEL
ALT: 19 IU/L (ref 0–32)
AST: 16 IU/L (ref 0–40)
Albumin: 4.4 g/dL (ref 3.8–4.9)
Alkaline Phosphatase: 59 IU/L (ref 49–135)
Bilirubin Total: 0.2 mg/dL (ref 0.0–1.2)
Bilirubin, Direct: 0.1 mg/dL (ref 0.00–0.40)
Total Protein: 7.2 g/dL (ref 6.0–8.5)

## 2024-10-03 MED ORDER — TERBINAFINE HCL 250 MG PO TABS: 250.0000 mg | ORAL_TABLET | Freq: Every day | ORAL | 0 refills | Status: AC

## 2024-10-06 ENCOUNTER — Other Ambulatory Visit: Payer: Self-pay

## 2024-10-06 DIAGNOSIS — Z79899 Other long term (current) drug therapy: Secondary | ICD-10-CM

## 2024-11-04 ENCOUNTER — Other Ambulatory Visit: Payer: Self-pay

## 2024-11-04 DIAGNOSIS — Z79899 Other long term (current) drug therapy: Secondary | ICD-10-CM

## 2024-11-05 ENCOUNTER — Ambulatory Visit: Payer: Self-pay | Admitting: Internal Medicine

## 2024-11-05 LAB — HEPATIC FUNCTION PANEL
ALT: 20 IU/L (ref 0–32)
AST: 19 IU/L (ref 0–40)
Albumin: 4.3 g/dL (ref 3.8–4.9)
Alkaline Phosphatase: 54 IU/L (ref 49–135)
Bilirubin Total: 0.3 mg/dL (ref 0.0–1.2)
Bilirubin, Direct: 0.12 mg/dL (ref 0.00–0.40)
Total Protein: 7.3 g/dL (ref 6.0–8.5)

## 2025-01-12 ENCOUNTER — Ambulatory Visit: Payer: Self-pay | Admitting: Internal Medicine

## 2025-01-12 ENCOUNTER — Telehealth: Admitting: Internal Medicine

## 2025-01-12 DIAGNOSIS — I10 Essential (primary) hypertension: Secondary | ICD-10-CM

## 2025-01-12 DIAGNOSIS — R7309 Other abnormal glucose: Secondary | ICD-10-CM

## 2025-01-12 DIAGNOSIS — E66813 Obesity, class 3: Secondary | ICD-10-CM

## 2025-01-12 DIAGNOSIS — B351 Tinea unguium: Secondary | ICD-10-CM

## 2025-01-12 MED ORDER — AMLODIPINE BESYLATE 5 MG PO TABS: 5.0000 mg | ORAL_TABLET | Freq: Every day | ORAL | 3 refills | Status: AC

## 2025-01-12 MED ORDER — VALSARTAN-HYDROCHLOROTHIAZIDE 160-12.5 MG PO TABS: 1.0000 | ORAL_TABLET | Freq: Every day | ORAL | 2 refills | Status: AC

## 2025-01-12 MED ORDER — WEGOVY 0.5 MG/0.5ML ~~LOC~~ SOAJ: 0.5000 mg | SUBCUTANEOUS | 1 refills | Status: AC

## 2025-01-12 NOTE — Assessment & Plan Note (Signed)
 Discussed Wegovy  for weight loss. No family history of thyroid  cancer. Insurance coverage uncertain. - Sent Wegovy  prescription to pharmacy. - Scheduled nurse visit for Wegovy  sample and instructions on how to self administer.  - Reminded of possible side effects and to stop eating when full.  - Scheduled weight follow-up in 6-8 weeks if approved.

## 2025-01-12 NOTE — Assessment & Plan Note (Signed)
 Previous labs reviewed, her A1c has been elevated in the past. I will check an A1c today. Reminded to avoid refined sugars including sugary drinks/foods and processed meats including bacon, sausages and deli meats.

## 2025-01-12 NOTE — Assessment & Plan Note (Signed)
 Completed terbinafine  with improvement. New nail growth observed. - Advised keeping affected nail clipped short. - Apply Vicks on nail and cuticles. - Monitor for regression or worsening. - Ordered liver function test with next labs.

## 2025-01-12 NOTE — Patient Instructions (Signed)
 Hypertension, Adult Hypertension is another name for high blood pressure. High blood pressure forces your heart to work harder to pump blood. This can cause problems over time. There are two numbers in a blood pressure reading. There is a top number (systolic) over a bottom number (diastolic). It is best to have a blood pressure that is below 120/80. What are the causes? The cause of this condition is not known. Some other conditions can lead to high blood pressure. What increases the risk? Some lifestyle factors can make you more likely to develop high blood pressure: Smoking. Not getting enough exercise or physical activity. Being overweight. Having too much fat, sugar, calories, or salt (sodium) in your diet. Drinking too much alcohol. Other risk factors include: Having any of these conditions: Heart disease. Diabetes. High cholesterol. Kidney disease. Obstructive sleep apnea. Having a family history of high blood pressure and high cholesterol. Age. The risk increases with age. Stress. What are the signs or symptoms? High blood pressure may not cause symptoms. Very high blood pressure (hypertensive crisis) may cause: Headache. Fast or uneven heartbeats (palpitations). Shortness of breath. Nosebleed. Vomiting or feeling like you may vomit (nauseous). Changes in how you see. Very bad chest pain. Feeling dizzy. Seizures. How is this treated? This condition is treated by making healthy lifestyle changes, such as: Eating healthy foods. Exercising more. Drinking less alcohol. Your doctor may prescribe medicine if lifestyle changes do not help enough and if: Your top number is above 130. Your bottom number is above 80. Your personal target blood pressure may vary. Follow these instructions at home: Eating and drinking  If told, follow the DASH eating plan. To follow this plan: Fill one half of your plate at each meal with fruits and vegetables. Fill one fourth of your plate  at each meal with whole grains. Whole grains include whole-wheat pasta, brown rice, and whole-grain bread. Eat or drink low-fat dairy products, such as skim milk or low-fat yogurt. Fill one fourth of your plate at each meal with low-fat (lean) proteins. Low-fat proteins include fish, chicken without skin, eggs, beans, and tofu. Avoid fatty meat, cured and processed meat, or chicken with skin. Avoid pre-made or processed food. Limit the amount of salt in your diet to less than 1,500 mg each day. Do not drink alcohol if: Your doctor tells you not to drink. You are pregnant, may be pregnant, or are planning to become pregnant. If you drink alcohol: Limit how much you have to: 0-1 drink a day for women. 0-2 drinks a day for men. Know how much alcohol is in your drink. In the [place] one drink equals one 12 oz bottle of beer (355 mL), one 5 oz glass of wine (148 mL), or one 1 oz glass of hard liquor (44 mL). Lifestyle  Work with your doctor to stay at a healthy weight or to lose weight. Ask your doctor what the best weight is for you. Get at least 30 minutes of exercise that causes your heart to beat faster (aerobic exercise) most days of the week. This may include walking, swimming, or biking. Get at least 30 minutes of exercise that strengthens your muscles (resistance exercise) at least 3 days a week. This may include lifting weights or doing Pilates. Do not smoke or use any products that contain nicotine or tobacco. If you need help quitting, ask your doctor. Check your blood pressure at home as told by your doctor. Keep all follow-up visits. Medicines Take over-the-counter and prescription medicines  only as told by your doctor. Follow directions carefully. Do not skip doses of blood pressure medicine. The medicine does not work as well if you skip doses. Skipping doses also puts you at risk for problems. Ask your doctor about side effects or reactions to medicines that you should watch  for. Contact a doctor if: You think you are having a reaction to the medicine you are taking. You have headaches that keep coming back. You feel dizzy. You have swelling in your ankles. You have trouble with your vision. Get help right away if: You get a very bad headache. You start to feel mixed up (confused). You feel weak or numb. You feel faint. You have very bad pain in your: Chest. Belly (abdomen). You vomit more than once. You have trouble breathing. These symptoms may be an emergency. Get help right away. Call 911. Do not wait to see if the symptoms will go away. Do not drive yourself to the hospital. Summary Hypertension is another name for high blood pressure. High blood pressure forces your heart to work harder to pump blood. For most people, a normal blood pressure is less than 120/80. Making healthy choices can help lower blood pressure. If your blood pressure does not get lower with healthy choices, you may need to take medicine. This information is not intended to replace advice given to you by your health care provider. Make sure you discuss any questions you have with your health care provider. Document Revised: 09/15/2021 Document Reviewed: 09/15/2021 Elsevier Patient Education  2024 [employer].

## 2025-01-13 ENCOUNTER — Ambulatory Visit: Payer: Self-pay

## 2025-01-22 ENCOUNTER — Other Ambulatory Visit

## 2025-05-19 ENCOUNTER — Encounter: Payer: Self-pay | Admitting: Internal Medicine
